# Patient Record
Sex: Male | Born: 1965 | Race: White | Hispanic: Yes | Smoking: Never smoker
Health system: Southern US, Community
[De-identification: ages and names within clinical notes are randomized; demographics above are authoritative.]

## PROBLEM LIST (undated history)

## (undated) DIAGNOSIS — E785 Hyperlipidemia, unspecified: Secondary | ICD-10-CM

## (undated) DIAGNOSIS — I2510 Atherosclerotic heart disease of native coronary artery without angina pectoris: Secondary | ICD-10-CM

## (undated) DIAGNOSIS — J690 Pneumonitis due to inhalation of food and vomit: Secondary | ICD-10-CM

## (undated) DIAGNOSIS — Z86010 Personal history of colonic polyps: Secondary | ICD-10-CM

## (undated) HISTORY — DX: Personal history of colonic polyps: Z86.010

## (undated) HISTORY — DX: Atherosclerotic heart disease of native coronary artery without angina pectoris: I25.10

## (undated) HISTORY — DX: Pneumonitis due to inhalation of food and vomit: J69.0

## (undated) HISTORY — DX: Hyperlipidemia, unspecified: E78.5

## (undated) HISTORY — PX: WISDOM TOOTH EXTRACTION: SHX21

## (undated) HISTORY — PX: COLONOSCOPY W/ POLYPECTOMY: SHX1380

---

## 2014-09-21 ENCOUNTER — Encounter: Payer: Self-pay | Admitting: Family Medicine

## 2014-09-21 ENCOUNTER — Ambulatory Visit (INDEPENDENT_AMBULATORY_CARE_PROVIDER_SITE_OTHER): Payer: PRIVATE HEALTH INSURANCE | Admitting: Family Medicine

## 2014-09-21 VITALS — BP 105/60 | HR 85 | Ht 62.0 in | Wt 113.0 lb

## 2014-09-21 DIAGNOSIS — Z1211 Encounter for screening for malignant neoplasm of colon: Secondary | ICD-10-CM

## 2014-09-21 DIAGNOSIS — Z0000 Encounter for general adult medical examination without abnormal findings: Secondary | ICD-10-CM | POA: Diagnosis not present

## 2014-09-21 NOTE — Progress Notes (Signed)
CC: Jonathan Mclaughlin is a 49 y.o. male is here for Establish Care and Annual Exam   Subjective: HPI:  Colonoscopy: has never had a colonoscopy, orders were placed today for referral. I've encouraged him to follow through with this to help with colon cancer detection Prostate: Discussed screening risks/beneifts with patient during today's visit. He would like to have a PSA done  Influenza Vaccine: up-to-date Pneumovax: no current indication Td/Tdap: up-to-date Zoster: (Start 51 yo)  Very pleasant 49 year old here to establish care requesting complete physical exam.   Review of Systems - General ROS: negative for - chills, fever, night sweats, weight gain or weight loss Ophthalmic ROS: negative for - decreased vision Psychological ROS: negative for - anxiety or depression ENT ROS: negative for - hearing change, nasal congestion, tinnitus or allergies Hematological and Lymphatic ROS: negative for - bleeding problems, bruising or swollen lymph nodes Breast ROS: negative Respiratory ROS: no cough, shortness of breath, or wheezing Cardiovascular ROS: no chest pain or dyspnea on exertion Gastrointestinal ROS: no abdominal pain, change in bowel habits, or black or bloody stools Genito-Urinary ROS: negative for - genital discharge, genital ulcers, incontinence or abnormal bleeding from genitals Musculoskeletal ROS: negative for - joint pain or muscle pain Neurological ROS: negative for - headaches or memory loss Dermatological ROS: negative for lumps, mole changes, rash and skin lesion changes  History reviewed. No pertinent past medical history.  History reviewed. No pertinent past surgical history. History reviewed. No pertinent family history.  History   Social History  . Marital Status: Divorced    Spouse Name: N/A  . Number of Children: N/A  . Years of Education: N/A   Occupational History  . Not on file.   Social History Main Topics  . Smoking status: Never Smoker   .  Smokeless tobacco: Not on file  . Alcohol Use: No  . Drug Use: No  . Sexual Activity:    Partners: Female   Other Topics Concern  . Not on file   Social History Narrative  . No narrative on file     Objective: BP 105/60 mmHg  Pulse 85  Ht 5\' 2"  (1.575 m)  Wt 113 lb (51.256 kg)  BMI 20.66 kg/m2  General: No Acute Distress HEENT: Atraumatic, normocephalic, conjunctivae normal without scleral icterus.  No nasal discharge, hearing grossly intact, TMs with good landmarks bilaterally with no middle ear abnormalities, posterior pharynx clear without oral lesions. Neck: Supple, trachea midline, no cervical nor supraclavicular adenopathy. Pulmonary: Clear to auscultation bilaterally without wheezing, rhonchi, nor rales. Cardiac: Regular rate and rhythm.  No murmurs, rubs, nor gallops. No peripheral edema.  2+ peripheral pulses bilaterally. Abdomen: Bowel sounds normal.  No masses.  Non-tender without rebound.  Negative Murphy's sign. MSK: Grossly intact, no signs of weakness.  Full strength throughout upper and lower extremities.  Full ROM in upper and lower extremities.  No midline spinal tenderness. Neuro: Gait unremarkable, CN II-XII grossly intact.  C5-C6 Reflex 2/4 Bilaterally, L4 Reflex 2/4 Bilaterally.  Cerebellar function intact. Skin: No rashes. Psych: Alert and oriented to person/place/time.  Thought process normal. No anxiety/depression.  Assessment & Plan: Jonathan Mclaughlin was seen today for establish care and annual exam.  Diagnoses and all orders for this visit:  Annual physical exam Orders: -     Lipid panel -     PSA -     CBC -     COMPLETE METABOLIC PANEL WITH GFR  Screening for colon cancer Orders: -     Ambulatory  referral to Gastroenterology   Healthy lifestyle interventions including but not limited to regular exercise, a healthy low fat diet, moderation of salt intake, the dangers of tobacco/alcohol/recreational drug use, nutrition supplementation, and accident  avoidance were discussed with the patient and a handout was provided for future reference.  Return in about 1 year (around 09/21/2015).

## 2014-09-22 ENCOUNTER — Encounter: Payer: Self-pay | Admitting: Gastroenterology

## 2014-09-23 LAB — COMPLETE METABOLIC PANEL WITH GFR
ALBUMIN: 3.9 g/dL (ref 3.5–5.2)
ALT: 13 U/L (ref 0–53)
AST: 18 U/L (ref 0–37)
Alkaline Phosphatase: 66 U/L (ref 39–117)
BUN: 12 mg/dL (ref 6–23)
CALCIUM: 8.8 mg/dL (ref 8.4–10.5)
CHLORIDE: 101 meq/L (ref 96–112)
CO2: 26 mEq/L (ref 19–32)
Creat: 0.76 mg/dL (ref 0.50–1.35)
GFR, Est African American: >89 mL/min
GFR, Est Non African American: >89 mL/min
Glucose, Bld: 76 mg/dL (ref 70–99)
Potassium: 3.8 mEq/L (ref 3.5–5.3)
Sodium: 139 mEq/L (ref 135–145)
Total Bilirubin: 0.7 mg/dL (ref 0.2–1.2)
Total Protein: 6.5 g/dL (ref 6.0–8.3)

## 2014-09-23 LAB — LIPID PANEL
CHOL/HDL RATIO: 3.1 ratio
Cholesterol: 187 mg/dL (ref 0–200)
HDL: 61 mg/dL (ref >=40)
LDL CALC: 116 mg/dL — AB (ref 0–99)
TRIGLYCERIDES: 51 mg/dL (ref <150)
VLDL: 10 mg/dL (ref 0–40)

## 2014-09-23 LAB — CBC
HCT: 43.1 % (ref 39.0–52.0)
HEMOGLOBIN: 14.6 g/dL (ref 13.0–17.0)
MCH: 29.9 pg (ref 26.0–34.0)
MCHC: 33.9 g/dL (ref 30.0–36.0)
MCV: 88.1 fL (ref 78.0–100.0)
MPV: 9.1 fL (ref 8.6–12.4)
Platelets: 255 10*3/uL (ref 150–400)
RBC: 4.89 MIL/uL (ref 4.22–5.81)
RDW: 13.8 % (ref 11.5–15.5)
WBC: 7.7 10*3/uL (ref 4.0–10.5)

## 2014-09-23 LAB — PSA: PSA: 1.04 ng/mL (ref <=4.00)

## 2014-12-01 ENCOUNTER — Ambulatory Visit (AMBULATORY_SURGERY_CENTER): Payer: Self-pay | Admitting: *Deleted

## 2014-12-01 VITALS — Ht 60.5 in | Wt 114.0 lb

## 2014-12-01 DIAGNOSIS — Z1211 Encounter for screening for malignant neoplasm of colon: Secondary | ICD-10-CM

## 2014-12-01 MED ORDER — NA SULFATE-K SULFATE-MG SULF 17.5-3.13-1.6 GM/177ML PO SOLN: 1.0000 | Freq: Once | ORAL | Status: DC

## 2014-12-01 NOTE — Progress Notes (Signed)
No egg or soy allergy. No anesthesia problems. Never been sedated other than for wisdom teeth.  No home O2.  No diet meds.

## 2014-12-09 ENCOUNTER — Ambulatory Visit (AMBULATORY_SURGERY_CENTER): Payer: PRIVATE HEALTH INSURANCE | Admitting: Gastroenterology

## 2014-12-09 ENCOUNTER — Encounter: Payer: Self-pay | Admitting: Gastroenterology

## 2014-12-09 VITALS — BP 93/56 | HR 63 | Temp 97.3°F | Resp 19 | Ht 62.0 in | Wt 113.0 lb

## 2014-12-09 DIAGNOSIS — D120 Benign neoplasm of cecum: Secondary | ICD-10-CM

## 2014-12-09 DIAGNOSIS — Z1211 Encounter for screening for malignant neoplasm of colon: Secondary | ICD-10-CM | POA: Diagnosis present

## 2014-12-09 DIAGNOSIS — K5730 Diverticulosis of large intestine without perforation or abscess without bleeding: Secondary | ICD-10-CM

## 2014-12-09 MED ORDER — SODIUM CHLORIDE 0.9 % IV SOLN: 500.0000 mL | INTRAVENOUS | Status: DC

## 2014-12-09 NOTE — Op Note (Signed)
[facility]  [address]. [place], [id number]   COLONOSCOPY PROCEDURE REPORT  PATIENT: Jonathan Mclaughlin, Jonathan Mclaughlin  MR#: [id number] BIRTHDATE: [birth year] , 48  yrs. old GENDER: male ENDOSCOPIST: Inda Castle, MD REFERRED BY: Marcial Pacas, DO PROCEDURE DATE:  12/09/2014 PROCEDURE:   Colonoscopy, screening and Colonoscopy with cold biopsy polypectomy First Screening Colonoscopy - Avg.  risk and is 50 yrs.  old or older Yes.  Prior Negative Screening - Now for repeat screening. N/A  History of Adenoma - Now for follow-up colonoscopy & has been > or = to 3 yrs.  N/A  Polyps removed today? Yes ASA CLASS:   Class II INDICATIONS:Colorectal Neoplasm Risk Assessment for this procedure is average risk. MEDICATIONS: Monitored anesthesia care and Propofol 150 mg IV  DESCRIPTION OF PROCEDURE:   After the risks benefits and alternatives of the procedure were thoroughly explained, informed consent was obtained.  The digital rectal exam revealed no abnormalities of the rectum.   The LB [id number]  endoscope was introduced through the anus and advanced to the cecum, which was identified by both the appendix and ileocecal valve. No adverse events experienced.   The quality of the prep was (Suprep was used) excellent.  The instrument was then slowly withdrawn as the colon was fully examined. Estimated blood loss is zero unless otherwise noted in this procedure report.      COLON FINDINGS: A sessile polyp measuring 2 mm in size was found at the cecum.  Polypectomies were performed with cold forceps.   There was mild diverticulosis noted in the sigmoid colon.   The examination was otherwise normal.  Retroflexed views revealed no abnormalities. The time to cecum = 2.7 Withdrawal time = 6.5   The scope was withdrawn and the procedure completed. COMPLICATIONS: There were no immediate complications.  ENDOSCOPIC IMPRESSION: 1.   Sessile polyp was found at the cecum; polypectomies  were performed with cold forceps 2.   Mild diverticulosis was noted in the sigmoid colon 3.   The examination was otherwise normal  RECOMMENDATIONS: If the polyp(s) removed today are proven to be adenomatous (pre-cancerous) polyps, you will need a repeat colonoscopy in 5 years.  Otherwise you should continue to follow colorectal cancer screening guidelines for "routine risk" patients with colonoscopy in 10 years.  You will receive a letter within 1-2 weeks with the results of your biopsy as well as final recommendations.  Please call my office if you have not received a letter after 3 weeks.  eSigned:  Inda Castle, MD 12/09/2014 10:02 AM   cc:   PATIENT NAME:  Jonathan Mclaughlin, Jonathan Mclaughlin MR#: [id number]

## 2014-12-09 NOTE — Progress Notes (Signed)
Called to room to assist during endoscopic procedure.  Patient ID and intended procedure confirmed with present staff. Received instructions for my participation in the procedure from the performing physician.  

## 2014-12-09 NOTE — Progress Notes (Signed)
To recovery, Report to Conchita Paris, RN, VSS

## 2014-12-09 NOTE — Patient Instructions (Signed)
YOU HAD AN ENDOSCOPIC PROCEDURE TODAY AT THE [facility]:   Refer to the procedure report that was given to you for any specific questions about what was found during the examination.  If the procedure report does not answer your questions, please call your gastroenterologist to clarify.  If you requested that your care partner not be given the details of your procedure findings, then the procedure report has been included in a sealed envelope for you to review at your convenience later.  YOU SHOULD EXPECT: Some feelings of bloating in the abdomen. Passage of more gas than usual.  Walking can help get rid of the air that was put into your GI tract during the procedure and reduce the bloating. If you had a lower endoscopy (such as a colonoscopy or flexible sigmoidoscopy) you may notice spotting of blood in your stool or on the toilet paper. If you underwent a bowel prep for your procedure, you may not have a normal bowel movement for a few days.  Please Note:  You might notice some irritation and congestion in your nose or some drainage.  This is from the oxygen used during your procedure.  There is no need for concern and it should clear up in a day or so.  SYMPTOMS TO REPORT IMMEDIATELY:   Following lower endoscopy (colonoscopy or flexible sigmoidoscopy):  Excessive amounts of blood in the stool  Significant tenderness or worsening of abdominal pains  Swelling of the abdomen that is new, acute  Fever of 100F or higher   For urgent or emergent issues, a gastroenterologist can be reached at any hour by calling [phone].   DIET: Your first meal following the procedure should be a small meal and then it is ok to progress to your normal diet. Heavy or fried foods are harder to digest and may make you feel nauseous or bloated.  Likewise, meals heavy in dairy and vegetables can increase bloating.  Drink plenty of fluids but you should avoid alcoholic beverages for 24 hours. Try to  increase the fiber in your diet.  ACTIVITY:  You should plan to take it easy for the rest of today and you should NOT DRIVE or use heavy machinery until tomorrow (because of the sedation medicines used during the test).    FOLLOW UP: Our staff will call the number listed on your records the next business day following your procedure to check on you and address any questions or concerns that you may have regarding the information given to you following your procedure. If we do not reach you, we will leave a message.  However, if you are feeling well and you are not experiencing any problems, there is no need to return our call.  We will assume that you have returned to your regular daily activities without incident.  If any biopsies were taken you will be contacted by phone or by letter within the next 1-3 weeks.  Please call us at [phone] if you have not heard about the biopsies in 3 weeks.    SIGNATURES/CONFIDENTIALITY: You and/or your care partner have signed paperwork which will be entered into your electronic medical record.  These signatures attest to the fact that that the information above on your After Visit Summary has been reviewed and is understood.  Full responsibility of the confidentiality of this discharge information lies with you and/or your care-partner.  Read all of the handouts given to you by your recovery room nurse. 

## 2014-12-12 ENCOUNTER — Telehealth: Payer: Self-pay | Admitting: *Deleted

## 2014-12-12 NOTE — Telephone Encounter (Signed)
Number identifier, left message, follow-up  

## 2014-12-13 ENCOUNTER — Encounter: Payer: Self-pay | Admitting: Gastroenterology

## 2015-01-12 ENCOUNTER — Encounter: Payer: Self-pay | Admitting: Family Medicine

## 2015-01-12 DIAGNOSIS — Z860100 Personal history of colon polyps, unspecified: Secondary | ICD-10-CM

## 2015-01-12 HISTORY — DX: Personal history of colonic polyps: Z86.010

## 2015-01-12 HISTORY — DX: Personal history of colon polyps, unspecified: Z86.0100

## 2015-05-12 ENCOUNTER — Emergency Department (INDEPENDENT_AMBULATORY_CARE_PROVIDER_SITE_OTHER): Payer: PRIVATE HEALTH INSURANCE

## 2015-05-12 ENCOUNTER — Emergency Department
Admission: EM | Admit: 2015-05-12 | Discharge: 2015-05-12 | Disposition: A | Discharge status: Home / Self Care | Payer: PRIVATE HEALTH INSURANCE | Source: Home / Self Care | Attending: Family Medicine | Admitting: Family Medicine

## 2015-05-12 ENCOUNTER — Encounter: Payer: Self-pay | Admitting: *Deleted

## 2015-05-12 DIAGNOSIS — M5412 Radiculopathy, cervical region: Secondary | ICD-10-CM

## 2015-05-12 DIAGNOSIS — S134XXA Sprain of ligaments of cervical spine, initial encounter: Secondary | ICD-10-CM

## 2015-05-12 DIAGNOSIS — S139XXA Sprain of joints and ligaments of unspecified parts of neck, initial encounter: Secondary | ICD-10-CM

## 2015-05-12 DIAGNOSIS — M542 Cervicalgia: Secondary | ICD-10-CM

## 2015-05-12 MED ORDER — PREDNISONE 20 MG PO TABS: 20.0000 mg | ORAL_TABLET | Freq: Two times a day (BID) | ORAL | Status: DC

## 2015-05-12 MED ORDER — CYCLOBENZAPRINE HCL 10 MG PO TABS: ORAL_TABLET | ORAL | Status: DC
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
## 2015-05-12 NOTE — Discharge Instructions (Signed)
Apply ice pack for 20 to 30 minutes, 3 to 4 times daily  Continue until pain decreases.    Cervical Radiculopathy Cervical radiculopathy happens when a nerve in the neck (cervical nerve) is pinched or bruised. This condition can develop because of an injury or as part of the normal aging process. Pressure on the cervical nerves can cause pain or numbness that runs from the neck all the way down into the arm and fingers. Usually, this condition gets better with rest. Treatment may be needed if the condition does not improve.  CAUSES This condition may be caused by:  Injury.  Slipped (herniated) disk.  Muscle tightness in the neck because of overuse.  Arthritis.  Breakdown or degeneration in the bones and joints of the spine (spondylosis) due to aging.  Bone spurs that may develop near the cervical nerves. SYMPTOMS Symptoms of this condition include:  Pain that runs from the neck to the arm and hand. The pain can be severe or irritating. It may be worse when the neck is moved.  Numbness or weakness in the affected arm and hand. DIAGNOSIS This condition may be diagnosed based on symptoms, medical history, and a physical exam. You may also have tests, including:  X-rays.  CT scan.  MRI.  Electromyogram (EMG).  Nerve conduction tests. TREATMENT In many cases, treatment is not needed for this condition. With rest, the condition usually gets better over time. If treatment is needed, options may include:  Wearing a soft neck collar for short periods of time.  Physical therapy to strengthen your neck muscles.  Medicines, such as NSAIDs, oral corticosteroids, or spinal injections.  Surgery. This may be needed if other treatments do not help. Various types of surgery may be done depending on the cause of your problems. HOME CARE INSTRUCTIONS Managing Pain  Take over-the-counter and prescription medicines only as told by your health care provider.  If directed, apply ice to  the affected area.  Put ice in a plastic bag.  Place a towel between your skin and the bag.  Leave the ice on for 20 minutes, 2-3 times per day.  If ice does not help, you can try using heat. Take a warm shower or warm bath, or use a heat pack as told by your health care provider.  Try a gentle neck and shoulder massage to help relieve symptoms. Activity  Rest as needed. Follow instructions from your health care provider about any restrictions on activities.  Do stretching and strengthening exercises as told by your health care provider or physical therapist. General Instructions  If you were given a soft collar, wear it as told by your health care provider.  Use a flat pillow when you sleep.  Keep all follow-up visits as told by your health care provider. This is important. SEEK MEDICAL CARE IF:  Your condition does not improve with treatment. SEEK IMMEDIATE MEDICAL CARE IF:  Your pain gets much worse and cannot be controlled with medicines.  You have weakness or numbness in your hand, arm, face, or leg.  You have a high fever.  You have a stiff, rigid neck.  You lose control of your bowels or your bladder (have incontinence).  You have trouble with walking, balance, or speaking.   This information is not intended to replace advice given to you by your health care provider. Make sure you discuss any questions you have with your health care provider.   Document Released: 12/04/2000 Document Revised: 11/30/2014 Document Reviewed: 05/05/2014  Elsevier Interactive Patient Education ©2016 [employer] ° °

## 2015-09-20 ENCOUNTER — Ambulatory Visit (INDEPENDENT_AMBULATORY_CARE_PROVIDER_SITE_OTHER): Payer: PRIVATE HEALTH INSURANCE | Admitting: Family Medicine

## 2015-09-20 ENCOUNTER — Encounter: Payer: Self-pay | Admitting: Family Medicine

## 2015-09-20 VITALS — BP 101/69 | HR 77 | Wt 121.0 lb

## 2015-09-20 DIAGNOSIS — Z0000 Encounter for general adult medical examination without abnormal findings: Secondary | ICD-10-CM

## 2015-09-20 DIAGNOSIS — Z125 Encounter for screening for malignant neoplasm of prostate: Secondary | ICD-10-CM

## 2015-09-20 LAB — CBC
HEMATOCRIT: 44.9 % (ref 38.5–50.0)
Hemoglobin: 15.5 g/dL (ref 13.2–17.1)
MCH: 30.5 pg (ref 27.0–33.0)
MCHC: 34.5 g/dL (ref 32.0–36.0)
MCV: 88.2 fL (ref 80.0–100.0)
MPV: 9.3 fL (ref 7.5–12.5)
PLATELETS: 216 10*3/uL (ref 140–400)
RBC: 5.09 MIL/uL (ref 4.20–5.80)
RDW: 13.3 % (ref 11.0–15.0)
WBC: 9.1 10*3/uL (ref 3.8–10.8)

## 2015-09-20 NOTE — Progress Notes (Signed)
CC: Jonathan Mclaughlin is a 50 y.o. male is here for Annual Exam   Subjective: HPI:  Colonoscopy: Due 2021 Prostate: Discussed screening risks/beneifts with patient today, screening with PSA   Influenza Vaccine: no indication Pneumovax: no indication Td/Tdap: UTD Zoster: (Start 52 yo)  Requesting complete physical exam With no complaints today  Review of Systems - General ROS: negative for - chills, fever, night sweats, weight gain or weight loss Ophthalmic ROS: negative for - decreased vision Psychological ROS: negative for - anxiety or depression ENT ROS: negative for - hearing change, nasal congestion, tinnitus or allergies Hematological and Lymphatic ROS: negative for - bleeding problems, bruising or swollen lymph nodes Breast ROS: negative Respiratory ROS: no cough, shortness of breath, or wheezing Cardiovascular ROS: no chest pain or dyspnea on exertion Gastrointestinal ROS: no abdominal pain, change in bowel habits, or black or bloody stools Genito-Urinary ROS: negative for - genital discharge, genital ulcers, incontinence or abnormal bleeding from genitals Musculoskeletal ROS: negative for - joint pain or muscle pain Neurological ROS: negative for - headaches or memory loss Dermatological ROS: negative for lumps, mole changes, rash and skin lesion changes   History reviewed. No pertinent past medical history.  Past Surgical History  Procedure Laterality Date  . Wisdom tooth extraction     Family History  Problem Relation Age of Onset  . Colon cancer Neg Hx     Social History   Social History  . Marital Status: Divorced    Spouse Name: N/A  . Number of Children: N/A  . Years of Education: N/A   Occupational History  . Not on file.   Social History Main Topics  . Smoking status: Never Smoker   . Smokeless tobacco: Never Used  . Alcohol Use: No  . Drug Use: No  . Sexual Activity:    Partners: Female   Other Topics Concern  . Not on file   Social History  Narrative     Objective: BP 101/69 mmHg  Pulse 77  Wt 121 lb (54.885 kg)  General: No Acute Distress HEENT: Atraumatic, normocephalic, conjunctivae normal without scleral icterus.  No nasal discharge, hearing grossly intact, TMs with good landmarks bilaterally with no middle ear abnormalities, posterior pharynx clear without oral lesions. Neck: Supple, trachea midline, no cervical nor supraclavicular adenopathy. Pulmonary: Clear to auscultation bilaterally without wheezing, rhonchi, nor rales. Cardiac: Regular rate and rhythm.  No murmurs, rubs, nor gallops. No peripheral edema.  2+ peripheral pulses bilaterally. Abdomen: Bowel sounds normal.  No masses.  Non-tender without rebound.  Negative Murphy's sign. MSK: Grossly intact, no signs of weakness.  Full strength throughout upper and lower extremities.  Full ROM in upper and lower extremities.  No midline spinal tenderness. Neuro: Gait unremarkable, CN II-XII grossly intact.  C5-C6 Reflex 2/4 Bilaterally, L4 Reflex 2/4 Bilaterally.  Cerebellar function intact. Skin: No rashes. Psych: Alert and oriented to person/place/time.  Thought process normal. No anxiety/depression.  Assessment & Plan: Simmon was seen today for annual exam.  Diagnoses and all orders for this visit:  Annual physical exam -     Lipid panel -     COMPLETE METABOLIC PANEL WITH GFR -     CBC -     PSA  Screening for prostate cancer -     PSA  Healthy lifestyle interventions including but not limited to regular exercise, a healthy low fat diet, moderation of salt intake, the dangers of tobacco/alcohol/recreational drug use, nutrition supplementation, and accident avoidance were discussed with the patient  and a handout was provided for future reference.  Return in about 1 year (around 09/19/2016) for Annual physical.

## 2015-09-20 NOTE — Patient Instructions (Signed)
Dr. Chailyn Racette's General Advice Following Your Complete Physical Exam  The Benefits of Regular Exercise: Unless you suffer from an uncontrolled cardiovascular condition, studies strongly suggest that regular exercise and physical activity will add to both the quality and length of your life.  The World Health Organization recommends 150 minutes of moderate intensity aerobic activity every week.  This is best split over 3-4 days a week, and can be as simple as a brisk walk for just over 35 minutes "most days of the week".  This type of exercise has been shown to lower LDL-Cholesterol, lower average blood sugars, lower blood pressure, lower cardiovascular disease risk, improve memory, and increase one's overall sense of wellbeing.  The addition of anaerobic (or "strength training") exercises offers additional benefits including but not limited to increased metabolism, prevention of osteoporosis, and improved overall cholesterol levels.  How Can I Strive For A Low-Fat Diet?: Current guidelines recommend that 25-35 percent of your daily energy (food) intake should come from fats.  One might ask how can this be achieved without having to dissect each meal on a daily basis?  Switch to skim or 1% milk instead of whole milk.  Focus on lean meats such as ground turkey, fresh fish, baked chicken, and lean cuts of beef as your source of dietary protein.  Limit saturated fat consumption to less than 10% of your daily caloric intake.  Limit trans fatty acid consumption primarily by limiting synthetic trans fats such as partially hydrogenated oils (Ex: fried fast foods).  Substitute olive or vegetable oil for solid fats where possible.  Moderation of Salt Intake: Provided you don't carry a diagnosis of congestive heart failure nor renal failure, I recommend a daily allowance of no more than 2300 mg of salt (sodium).  Keeping under this daily goal is associated with a decreased risk of cardiovascular events, creeping  above it can lead to elevated blood pressures and increases your risk of cardiovascular events.  Milligrams (mg) of salt is listed on all nutrition labels, and your daily intake can add up faster than you think.  Most canned and frozen dinners can pack in over half your daily salt allowance in one meal.    Lifestyle Health Risks: Certain lifestyle choices carry specific health risks.  As you may already know, tobacco use has been associated with increasing one's risk of cardiovascular disease, pulmonary disease, numerous cancers, among many other issues.  What you may not know is that there are medications and nicotine replacement strategies that can more than double your chances of successfully quitting.  I would be thrilled to help manage your quitting strategy if you currently use tobacco products.  When it comes to alcohol use, I've yet to find an "ideal" daily allowance.  Provided an individual does not have a medical condition that is exacerbated by alcohol consumption, general guidelines determine "safe drinking" as no more than two standard drinks for a man or no more than one standard drink for a male per day.  However, much debate still exists on whether any amount of alcohol consumption is technically "safe".  My general advice, keep alcohol consumption to a minimum for general health promotion.  If you or others believe that alcohol, tobacco, or recreational drug use is interfering with your life, I would be happy to provide confidential counseling regarding treatment options.  General "Over The Counter" Nutrition Advice: Postmenopausal women should aim for a daily calcium intake of 1200 mg, however a significant portion of this might already be   provided by diets including milk, yogurt, cheese, and other dairy products.  Vitamin D has been shown to help preserve bone density, prevent fatigue, and has even been shown to help reduce falls in the elderly.  Ensuring a daily intake of 800 Units of  Vitamin D is a good place to start to enjoy the above benefits, we can easily check your Vitamin D level to see if you'd potentially benefit from supplementation beyond 800 Units a day.  Folic Acid intake should be of particular concern to women of childbearing age.  Daily consumption of 400-800 mcg of Folic Acid is recommended to minimize the chance of spinal cord defects in a fetus should pregnancy occur.    For many adults, accidents still remain one of the most common culprits when it comes to cause of death.  Some of the simplest but most effective preventitive habits you can adopt include regular seatbelt use, proper helmet use, securing firearms, and regularly testing your smoke and carbon monoxide detectors.  Jonathan Klingbeil B. Markevion Lattin DO [facility] [address], [address] [place], [place] [address] Phone: [phone]  

## 2015-09-21 ENCOUNTER — Encounter: Payer: PRIVATE HEALTH INSURANCE | Admitting: Family Medicine

## 2015-09-21 ENCOUNTER — Encounter: Payer: Self-pay | Admitting: Family Medicine

## 2015-09-21 LAB — COMPLETE METABOLIC PANEL WITH GFR
ALT: 19 U/L (ref 9–46)
AST: 20 U/L (ref 10–35)
Albumin: 4.2 g/dL (ref 3.6–5.1)
Alkaline Phosphatase: 62 U/L (ref 40–115)
BILIRUBIN TOTAL: 0.6 mg/dL (ref 0.2–1.2)
BUN: 19 mg/dL (ref 7–25)
CO2: 23 mmol/L (ref 20–31)
CREATININE: 0.73 mg/dL (ref 0.70–1.33)
Calcium: 8.8 mg/dL (ref 8.6–10.3)
Chloride: 103 mmol/L (ref 98–110)
GFR, Est African American: >89 mL/min (ref >=60)
GFR, Est Non African American: >89 mL/min (ref >=60)
GLUCOSE: 93 mg/dL (ref 65–99)
Potassium: 3.8 mmol/L (ref 3.5–5.3)
SODIUM: 138 mmol/L (ref 135–146)
TOTAL PROTEIN: 6.9 g/dL (ref 6.1–8.1)

## 2015-09-21 LAB — LIPID PANEL
Cholesterol: 220 mg/dL — ABNORMAL HIGH (ref 125–200)
HDL: 69 mg/dL (ref >=40)
LDL CALC: 140 mg/dL — AB (ref <130)
TRIGLYCERIDES: 54 mg/dL (ref <150)
Total CHOL/HDL Ratio: 3.2 Ratio (ref <=5.0)
VLDL: 11 mg/dL (ref <30)

## 2015-09-21 LAB — PSA: PSA: 1.08 ng/mL (ref <=4.00)

## 2016-09-16 ENCOUNTER — Encounter: Payer: PRIVATE HEALTH INSURANCE | Admitting: Physician Assistant

## 2016-09-16 DIAGNOSIS — Z0189 Encounter for other specified special examinations: Secondary | ICD-10-CM

## 2016-09-19 ENCOUNTER — Ambulatory Visit (INDEPENDENT_AMBULATORY_CARE_PROVIDER_SITE_OTHER): Payer: PRIVATE HEALTH INSURANCE

## 2016-09-19 ENCOUNTER — Ambulatory Visit (INDEPENDENT_AMBULATORY_CARE_PROVIDER_SITE_OTHER): Payer: PRIVATE HEALTH INSURANCE | Admitting: Physician Assistant

## 2016-09-19 ENCOUNTER — Encounter: Payer: Self-pay | Admitting: Physician Assistant

## 2016-09-19 VITALS — BP 94/57 | HR 82 | Resp 16 | Wt 124.3 lb

## 2016-09-19 DIAGNOSIS — R0789 Other chest pain: Secondary | ICD-10-CM | POA: Diagnosis not present

## 2016-09-19 DIAGNOSIS — Z0000 Encounter for general adult medical examination without abnormal findings: Secondary | ICD-10-CM

## 2016-09-19 DIAGNOSIS — R0602 Shortness of breath: Secondary | ICD-10-CM | POA: Insufficient documentation

## 2016-09-19 DIAGNOSIS — R9389 Abnormal findings on diagnostic imaging of other specified body structures: Secondary | ICD-10-CM

## 2016-09-19 DIAGNOSIS — J9859 Other diseases of mediastinum, not elsewhere classified: Secondary | ICD-10-CM

## 2016-09-19 DIAGNOSIS — R938 Abnormal findings on diagnostic imaging of other specified body structures: Secondary | ICD-10-CM

## 2016-09-19 DIAGNOSIS — Z1159 Encounter for screening for other viral diseases: Secondary | ICD-10-CM

## 2016-09-19 DIAGNOSIS — Z125 Encounter for screening for malignant neoplasm of prostate: Secondary | ICD-10-CM | POA: Diagnosis not present

## 2016-09-19 DIAGNOSIS — E782 Mixed hyperlipidemia: Secondary | ICD-10-CM | POA: Diagnosis not present

## 2016-09-19 LAB — CBC
HCT: 43.6 % (ref 38.5–50.0)
Hemoglobin: 14.4 g/dL (ref 13.2–17.1)
MCH: 29.2 pg (ref 27.0–33.0)
MCHC: 33 g/dL (ref 32.0–36.0)
MCV: 88.4 fL (ref 80.0–100.0)
MPV: 9 fL (ref 7.5–12.5)
PLATELETS: 253 10*3/uL (ref 140–400)
RBC: 4.93 MIL/uL (ref 4.20–5.80)
RDW: 13.8 % (ref 11.0–15.0)
WBC: 7.3 10*3/uL (ref 3.8–10.8)

## 2016-09-19 NOTE — Patient Instructions (Addendum)
Physical Activity Recommendations for modifying lipids and lowering blood pressure Engage in aerobic physical activity to reduce LDL-cholesterol, non-HDL-cholesterol, and blood pressure  Frequency: 3-4 sessions per week  Intensity: moderate to vigorous  Duration: 40 minutes on average  Physical Activity Recommendations for secondary prevention 1. Aerobic exercise  Frequency: 3-5 sessions per week  Intensity: 50-80% capacity  Duration: 20 - 60 minutes  Examples: walking, treadmill, cycling, rowing, stair climbing, and arm/leg ergometry  2. Resistance exercise  Frequency: 2-3 sessions per week  Intensity: 10-15 repetitions/set to moderate fatigue  Duration: 1-3 sets of 8-10 upper and lower body exercises  Examples: calisthenics, elastic bands, cuff/hand weights, dumbbels, free weights, wall pulleys, and weight machines  Heart-Healthy Lifestyle  Eating a diet rich in vegetables, fruits and whole grains: also includes low-fat dairy products, poultry, fish, legumes, and nuts; limit intake of sweets, sugar-sweetened beverages and red meats  Getting regular exercise  Maintaining a healthy weight  Not smoking or getting help quitting  Staying on top of your health; for some people, lifestyle changes alone may not be enough to prevent a heart attack or stroke. In these cases, taking a statin at the right dose will most likely be necessary  
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
## 2016-09-20 LAB — COMPREHENSIVE METABOLIC PANEL
ALT: 16 U/L (ref 9–46)
AST: 18 U/L (ref 10–35)
Albumin: 4 g/dL (ref 3.6–5.1)
Alkaline Phosphatase: 82 U/L (ref 40–115)
BUN: 13 mg/dL (ref 7–25)
CHLORIDE: 102 mmol/L (ref 98–110)
CO2: 26 mmol/L (ref 20–31)
CREATININE: 0.7 mg/dL (ref 0.70–1.33)
Calcium: 9.2 mg/dL (ref 8.6–10.3)
Glucose, Bld: 97 mg/dL (ref 65–99)
Potassium: 4.3 mmol/L (ref 3.5–5.3)
SODIUM: 137 mmol/L (ref 135–146)
TOTAL PROTEIN: 7 g/dL (ref 6.1–8.1)
Total Bilirubin: 0.5 mg/dL (ref 0.2–1.2)

## 2016-09-20 LAB — LIPID PANEL W/REFLEX DIRECT LDL
CHOL/HDL RATIO: 3.8 ratio (ref <5.0)
Cholesterol: 200 mg/dL — ABNORMAL HIGH (ref <200)
HDL: 52 mg/dL (ref >40)
LDL-Cholesterol: 129 mg/dL — ABNORMAL HIGH
NON-HDL CHOLESTEROL (CALC): 148 mg/dL — AB (ref <130)
Triglycerides: 90 mg/dL (ref <150)

## 2016-09-20 LAB — PSA: PSA: 1.3 ng/mL (ref <=4.0)

## 2016-09-20 LAB — HEPATITIS C ANTIBODY: HCV Ab: NEGATIVE

## 2016-09-22 DIAGNOSIS — R911 Solitary pulmonary nodule: Secondary | ICD-10-CM | POA: Insufficient documentation

## 2016-09-22 DIAGNOSIS — R9389 Abnormal findings on diagnostic imaging of other specified body structures: Secondary | ICD-10-CM | POA: Insufficient documentation

## 2016-09-22 DIAGNOSIS — IMO0001 Reserved for inherently not codable concepts without codable children: Secondary | ICD-10-CM | POA: Insufficient documentation

## 2016-09-23 NOTE — Progress Notes (Signed)
Your labs look good - PSA is normal - normal kidney function - cholesterol in a healthy range - normal blood counts, no evidence of anemia - no evidence of diabetes

## 2016-09-23 NOTE — Progress Notes (Signed)
Chest x-ray showed some abnormalities. The heart and aorta were mildly enlarged. I have ordered a CT scan on the chest, which I would like him to have done this week. The lungs were clear and there was no sign of infection

## 2016-09-26 ENCOUNTER — Ambulatory Visit (INDEPENDENT_AMBULATORY_CARE_PROVIDER_SITE_OTHER): Payer: PRIVATE HEALTH INSURANCE

## 2016-09-26 DIAGNOSIS — J690 Pneumonitis due to inhalation of food and vomit: Secondary | ICD-10-CM

## 2016-09-26 DIAGNOSIS — R911 Solitary pulmonary nodule: Secondary | ICD-10-CM | POA: Diagnosis not present

## 2016-09-26 DIAGNOSIS — J9859 Other diseases of mediastinum, not elsewhere classified: Secondary | ICD-10-CM

## 2016-09-26 DIAGNOSIS — R9389 Abnormal findings on diagnostic imaging of other specified body structures: Secondary | ICD-10-CM

## 2016-09-26 DIAGNOSIS — R918 Other nonspecific abnormal finding of lung field: Secondary | ICD-10-CM

## 2016-09-26 MED ORDER — IOPAMIDOL (ISOVUE-300) INJECTION 61%
100.0000 mL | Freq: Once | INTRAVENOUS | Status: AC | PRN
Start: 2016-09-26 — End: 2016-09-26
  Administered 2016-09-26: 80 mL via INTRAVENOUS

## 2016-09-27 ENCOUNTER — Encounter: Payer: Self-pay | Admitting: Physician Assistant

## 2016-09-27 DIAGNOSIS — J690 Pneumonitis due to inhalation of food and vomit: Secondary | ICD-10-CM | POA: Insufficient documentation

## 2016-09-27 HISTORY — DX: Pneumonitis due to inhalation of food and vomit: J69.0

## 2016-09-27 MED ORDER — METRONIDAZOLE 500 MG PO TABS: 500.0000 mg | ORAL_TABLET | Freq: Two times a day (BID) | ORAL | 0 refills | Status: AC

## 2016-09-27 MED ORDER — AZITHROMYCIN 250 MG PO TABS: ORAL_TABLET | ORAL | 0 refills | Status: DC

## 2016-09-27 NOTE — Progress Notes (Unsigned)
Ground glass opacity in the RML. Will treat empirically as aspiration pneumonia   4.90mm nodule in the lingula. Surveillance with repeat CT chest w/contrast in 1 year

## 2016-10-08 ENCOUNTER — Encounter: Payer: Self-pay | Admitting: Physician Assistant

## 2016-10-08 ENCOUNTER — Ambulatory Visit (INDEPENDENT_AMBULATORY_CARE_PROVIDER_SITE_OTHER): Payer: PRIVATE HEALTH INSURANCE | Admitting: Physician Assistant

## 2016-10-08 VITALS — BP 95/59 | HR 71 | Temp 98.3°F | Wt 124.0 lb

## 2016-10-08 DIAGNOSIS — R911 Solitary pulmonary nodule: Secondary | ICD-10-CM | POA: Diagnosis not present

## 2016-10-08 DIAGNOSIS — I950 Idiopathic hypotension: Secondary | ICD-10-CM | POA: Diagnosis not present

## 2016-10-08 DIAGNOSIS — IMO0001 Reserved for inherently not codable concepts without codable children: Secondary | ICD-10-CM

## 2016-10-08 DIAGNOSIS — Z09 Encounter for follow-up examination after completed treatment for conditions other than malignant neoplasm: Secondary | ICD-10-CM

## 2016-10-08 DIAGNOSIS — I959 Hypotension, unspecified: Secondary | ICD-10-CM | POA: Insufficient documentation

## 2016-10-08 NOTE — Progress Notes (Signed)
HPI:                                                                Jonathan Mclaughlin is a 51 y.o. male who presents to [facility] [place] today for pneumonia follow-up  Patient found to have a ground glass opacity in the RML on chest CT. He was treated for suspected aspiration pnuemonia with Flagyl and Azithromycin. He completed antibiotic therapy and reports he is feeling well. Denies fever, chills, cough, shortness of breath, or chest discomfort.   Past Medical History:  Diagnosis Date  . Aspiration pneumonia ([place]) 09/27/2016  . History of colonic polyps 01/12/2015   Repeat 11/2019   . Hyperlipidemia    Past Surgical History:  Procedure Laterality Date  . COLONOSCOPY W/ POLYPECTOMY    . WISDOM TOOTH EXTRACTION     Social History  Substance Use Topics  . Smoking status: Never Smoker  . Smokeless tobacco: Never Used  . Alcohol use No   family history includes Alcohol abuse in his father; Arthritis in his mother.  ROS: negative except as noted in the HPI  Medications: No current outpatient prescriptions on file.   No current facility-administered medications for this visit.    No Known Allergies     Objective:  BP (!) 95/59   Pulse 71   Temp 98.3 F (36.8 C)   Wt 124 lb (56.2 kg)   SpO2 95%   BMI 22.68 kg/m  Gen: well-groomed, cooperative, not ill-appearing, no distress HEENT: normal conjunctiva, trachea midline Pulm: Normal work of breathing, normal phonation, clear to auscultation bilaterally, no wheezes, rales or rhonchi CV: Normal rate, regular rhythm, s1 and s2 distinct, no murmurs, clicks or rubs  Neuro: alert and oriented x 3, EOM's intact, no tremor MSK: moving all extremities, normal gait and station, no peripheral edema Skin: warm, dry, intact; no rashes or lesions on exposed skin, no cyanosis   No results found for this or any previous visit (from the past 72 hour(s)). No results found.    Assessment and  Plan: 51 y.o. male with   1. Follow-up exam after treatment - pneumonia has resolved clinically. Afebrile, SpO2 95% on RA   2. Idiopathic hypotension BP Readings from Last 3 Encounters:  10/08/16 (!) 95/59  09/19/16 (!) 94/57  09/20/15 101/69  - patient asymptomatic. Denies orthostasis, lightheadedness, syncope - encouraged PO hydration and increased salt intake  3. Lung nodule <6cm on CT - follow-up CT chest in 1 year to survey lingula pulmonary nodule  Patient education and anticipatory guidance given Patient agrees with treatment plan Follow-up in 1 year for CT chest as needed if symptoms worsen or fail to improve  Darlyne Russian PA-C

## 2016-10-08 NOTE — Patient Instructions (Signed)
   Hipotensin (Hypotension) Cuando el corazn late, bombea la sangre a travs del cuerpo. La fuerza que origina es la presin arterial. Si sufre hipotensin, la presin arterial es baja. Cuando la presin arterial es demasiado baja, es posible que no llegue sangre suficiente al cerebro. Podr sentir debilidad, Tree surgeon, latidos cardacos acelerados o perder el conocimiento (se desmaya). CUIDADOS EN EL HOGAR  Beba gran cantidad de lquido para mantener el pis (orina) de tono claro o amarillo plido.  Tome los [employer] le indic el mdico.  Levntese lentamente luego de estar sentado o acostado.  Use medias de compresin segn las indicaciones de su mdico.  Siga una dieta saludable e incluya alimentos nutritivos como frutas, vegetales, nueces, granos enteros y carnes [place].  SOLICITE AYUDA SI:  Devuelve (vomita) o tiene deposiciones acuosas (diarrea).  Tiene fiebre por ms de 2 - 3 das.  Tiene ms sed que lo habitual.  Se siente dbil y cansado.  SOLICITE AYUDA DE INMEDIATO SI:  Pierde el conocimiento (se desmaya).  Siente dolor en el pecho o latidos cardacos irregulares.  Pierde la sensibilidad en una parte de su cuerpo.  No puede mover los brazos o las piernas.  Tiene dificultad para hablar.  Se siente transpirado o sufre un desmayo.  ASEGRESE DE QUE:  Comprende estas instrucciones.  Controlar su afeccin.  Recibir ayuda de inmediato si no mejora o si empeora.  Esta informacin no tiene Marine scientist el consejo del mdico. Asegrese de hacerle al mdico cualquier pregunta que tenga. Document Released: 09/10/2011 Document Revised: 11/11/2012 Document Reviewed: 08/31/2015 Elsevier Interactive Patient Education  2017 [employer].

## 2016-12-04 ENCOUNTER — Emergency Department (HOSPITAL_COMMUNITY): Payer: PRIVATE HEALTH INSURANCE

## 2016-12-04 ENCOUNTER — Emergency Department (HOSPITAL_COMMUNITY)
Admission: EM | Admit: 2016-12-04 | Discharge: 2016-12-04 | Disposition: A | Discharge status: Home / Self Care | Payer: PRIVATE HEALTH INSURANCE | Attending: Emergency Medicine | Admitting: Emergency Medicine

## 2016-12-04 ENCOUNTER — Encounter (HOSPITAL_COMMUNITY): Payer: Self-pay | Admitting: *Deleted

## 2016-12-04 DIAGNOSIS — R0789 Other chest pain: Secondary | ICD-10-CM | POA: Insufficient documentation

## 2016-12-04 LAB — CBC
HCT: 44.5 % (ref 39.0–52.0)
HEMOGLOBIN: 15.3 g/dL (ref 13.0–17.0)
MCH: 29.7 pg (ref 26.0–34.0)
MCHC: 34.4 g/dL (ref 30.0–36.0)
MCV: 86.4 fL (ref 78.0–100.0)
Platelets: 217 10*3/uL (ref 150–400)
RBC: 5.15 MIL/uL (ref 4.22–5.81)
RDW: 12.4 % (ref 11.5–15.5)
WBC: 7.9 10*3/uL (ref 4.0–10.5)

## 2016-12-04 LAB — BASIC METABOLIC PANEL
ANION GAP: 7 (ref 5–15)
BUN: 12 mg/dL (ref 6–20)
CALCIUM: 9.1 mg/dL (ref 8.9–10.3)
CO2: 28 mmol/L (ref 22–32)
Chloride: 102 mmol/L (ref 101–111)
Creatinine, Ser: 0.73 mg/dL (ref 0.61–1.24)
Glucose, Bld: 103 mg/dL — ABNORMAL HIGH (ref 65–99)
POTASSIUM: 3.8 mmol/L (ref 3.5–5.1)
Sodium: 137 mmol/L (ref 135–145)

## 2016-12-04 LAB — I-STAT TROPONIN, ED: TROPONIN I, POC: 0 ng/mL (ref 0.00–0.08)

## 2016-12-04 NOTE — ED Triage Notes (Signed)
Pt is here with pain in his chest that he woke up with on Sunday, had some Monday and Tuesday.  Today it is much stronger.

## 2016-12-04 NOTE — ED Provider Notes (Signed)
[facility] DEPT Provider Note   CSN: [id number] Arrival date & time: 12/04/16  1635     History   Chief Complaint Chief Complaint  Patient presents with  . Chest Pain    HPI Jonathan Mclaughlin is a 51 y.o. male.  HPI Patient presents with right-sided chest pain. Began 3 days ago. Dull in his right chest. Worse in the mornings. Not worse with exertion. Worse with movements. No shortness of breath. No swelling in his legs. States he thinks he may have slept wrong on it. No trauma. States it feels like a muscle pull. He does not smoke. Does have a history of high cholesterol but no cardiac history. Denies substance abuse. Denies early family history of coronary artery disease.patient has been takingNSAIDs for the pain. States it does help somewhat. Past Medical History:  Diagnosis Date  . Aspiration pneumonia ([place]) 09/27/2016  . History of colonic polyps 01/12/2015   Repeat 11/2019   . Hyperlipidemia     Patient Active Problem List   Diagnosis Date Noted  . Hypotension 10/08/2016  . Abnormal chest x-ray 09/22/2016  . Lung nodule < 6cm on CT 09/22/2016  . Hyperlipidemia 09/21/2015  . History of colonic polyps 01/12/2015    Past Surgical History:  Procedure Laterality Date  . COLONOSCOPY W/ POLYPECTOMY    . WISDOM TOOTH EXTRACTION         Home Medications    Prior to Admission medications   Not on File    Family History Family History  Problem Relation Age of Onset  . Arthritis Mother   . Alcohol abuse Father   . Colon cancer Neg Hx   . Heart attack Neg Hx     Social History Social History  Substance Use Topics  . Smoking status: Never Smoker  . Smokeless tobacco: Never Used  . Alcohol use No     Allergies   Patient has no known allergies.   Review of Systems Review of Systems  Constitutional: Negative for appetite change and fever.  HENT: Negative for congestion.   Respiratory: Negative for cough, shortness of breath and wheezing.     Cardiovascular: Positive for chest pain.  Gastrointestinal: Negative for abdominal pain.  Genitourinary: Negative for flank pain.  Musculoskeletal: Negative for back pain.  Skin: Negative for rash.  Neurological: Negative for speech difficulty and numbness.  Hematological: Negative for adenopathy.  Psychiatric/Behavioral: Negative for confusion.     Physical Exam Updated Vital Signs BP 97/74   Pulse 74   Temp 98.6 F (37 C) (Oral)   Resp (!) 23   SpO2 98%   Physical Exam  Constitutional: He appears well-developed.  HENT:  Head: Normocephalic.  Eyes: No scleral icterus.  Neck: Neck supple.  Cardiovascular: Normal rate.   Pulmonary/Chest: He exhibits tenderness.  Mild tenderness in right anterior lateral chest wall. No rash. No crepitance or deformity.  Abdominal: Soft.  Musculoskeletal: He exhibits no edema.  Neurological: He is alert.  Skin: Skin is warm. Capillary refill takes less than 2 seconds.  Psychiatric: He has a normal mood and affect.     ED Treatments / Results  Labs (all labs ordered are listed, but only abnormal results are displayed) Labs Reviewed  BASIC METABOLIC PANEL - Abnormal; Notable for the following:       Result Value   Glucose, Bld 103 (*)    All other components within normal limits  CBC  I-STAT TROPONIN, ED    EKG  EKG Interpretation  Date/Time:  Wednesday  December 04 2016 16:42:46 EDT Ventricular Rate:  71 PR Interval:  164 QRS Duration: 74 QT Interval:  360 QTC Calculation: 391 R Axis:   87 Text Interpretation:  Normal sinus rhythm Normal ECG Confirmed by Davonna Belling [phone]) on 12/04/2016 9:38:33 PM       Radiology Dg Chest 2 View  Result Date: 12/04/2016 CLINICAL DATA:  Right chest pain starting Sunday, intermittent. EXAM: CHEST  2 VIEW COMPARISON:  Chest CT from 09/26/2016 FINDINGS: The heart size and mediastinal contours are within normal limits. Both lungs are clear. The visualized skeletal structures are  unremarkable. IMPRESSION: No active cardiopulmonary disease. Electronically Signed   By: Van Clines M.D.   On: 12/04/2016 17:23    Procedures Procedures (including critical care time)  Medications Ordered in ED Medications - No data to display   Initial Impression / Assessment and Plan / ED Course  I have reviewed the triage vital signs and the nursing notes.  Pertinent labs & imaging results that were available during my care of the patient were reviewed by me and considered in my medical decision making (see chart for details).     Patient resents with chest pain. Reproducible. Low risk cardiac. Doubt pulmonary embolism. Likely chest wall pain. Discharge home.  Final Clinical Impressions(s) / ED Diagnoses   Final diagnoses:  Chest wall pain    New Prescriptions New Prescriptions   No medications on file     Davonna Belling, MD 12/04/16 2224

## 2017-09-10 ENCOUNTER — Encounter: Payer: Self-pay | Admitting: Physician Assistant

## 2017-09-10 ENCOUNTER — Ambulatory Visit (INDEPENDENT_AMBULATORY_CARE_PROVIDER_SITE_OTHER): Payer: PRIVATE HEALTH INSURANCE | Admitting: Physician Assistant

## 2017-09-10 VITALS — BP 99/66 | HR 76 | Resp 16 | Wt 123.0 lb

## 2017-09-10 DIAGNOSIS — M7711 Lateral epicondylitis, right elbow: Secondary | ICD-10-CM | POA: Diagnosis not present

## 2017-09-10 DIAGNOSIS — E782 Mixed hyperlipidemia: Secondary | ICD-10-CM

## 2017-09-10 DIAGNOSIS — E789 Disorder of lipoprotein metabolism, unspecified: Secondary | ICD-10-CM

## 2017-09-10 DIAGNOSIS — Z125 Encounter for screening for malignant neoplasm of prostate: Secondary | ICD-10-CM

## 2017-09-10 DIAGNOSIS — M25511 Pain in right shoulder: Secondary | ICD-10-CM | POA: Insufficient documentation

## 2017-09-10 DIAGNOSIS — R7301 Impaired fasting glucose: Secondary | ICD-10-CM

## 2017-09-10 DIAGNOSIS — R918 Other nonspecific abnormal finding of lung field: Secondary | ICD-10-CM

## 2017-09-10 DIAGNOSIS — Z130 Encounter for screening for diseases of the blood and blood-forming organs and certain disorders involving the immune mechanism: Secondary | ICD-10-CM | POA: Diagnosis not present

## 2017-09-10 DIAGNOSIS — M79631 Pain in right forearm: Secondary | ICD-10-CM | POA: Insufficient documentation

## 2017-09-10 DIAGNOSIS — Z0000 Encounter for general adult medical examination without abnormal findings: Secondary | ICD-10-CM | POA: Diagnosis not present

## 2017-09-10 NOTE — Patient Instructions (Signed)
Anlisis de antgeno prosttico especfico (Prostate-Specific Antigen Test) POR QU ME DEBO REALIZAR ESTE ANLISIS? El anlisis de antgeno prosttico especfico (PSA) se realiza para Office manager cantidad de PSA que tiene Herbalist. El PSA es un tipo de protena que generalmente est presente en la prstata. Ciertas enfermedades pueden hacer que los niveles sanguneos de PSA aumenten, tales como:  Infeccin de la prstata (prostatitis).  Agrandamiento de la prstata (hipertrofia).  Cncer de prstata. Dado que los niveles de PSA aumentan mucho a causa del cncer de prstata, este anlisis se puede usar para confirmar un diagnstico de Stage manager. Tambin se puede usar para Freight forwarder tratamiento para Science writer de prstata y para ver si el cncer de prstata vuelve a aparecer despus de que el tratamiento ha finalizado. Este [employer] tiene un ndice muy alto de [place] positivos falsos. Por lo tanto, ya no se recomienda la prueba de deteccin de rutina del PSA para todos los hombres. Este tipo de resultado es incorrecto porque indica que una enfermedad o un hallazgo est presente cuando en realidad no lo est. QU TIPO DE MUESTRA SE TOMA? Para esta prueba, se extrae Truddie Coco de [place]. Por lo general, para extraerla, se introduce una aguja en una vena o se pincha un dedo con una aguja pequea. CMO DEBO PREPARARME PARA ESTE ANLISIS? No se requiere preparacin para este anlisis. Sin embargo, Public librarian otros factores que pueden afectar los resultados del [place] de PSA. Para obtener los resultados ms precisos:  Engineer, materials un examen rectal en el perodo de varias horas previas a la extraccin de sangre para este anlisis.  Evite cualquier procedimiento en la prstata en el perodo de las 6 semanas previas a este anlisis.  Evite eyacular dentro de las 24horas previas a este anlisis.  Infrmele al mdico si tuvo una infeccin urinaria reciente.  Infrmele al mdico si est  tomando medicamentos para ayudar a que le crezca el pelo, como finasterida.  Infrmele al mdico si ha estado expuesto a un medicamento llamado dietilestilbestrol. Infrmele al mdico si cualquiera de estos factores corresponde a su caso. Es posible que se le pida que reprograme el [place]. [place]? Los valores de referencia se establecen despus de realizarle el anlisis a un grupo grande de Engineer, manufacturing. Pueden variar [employer], laboratorios y hospitales. Es su responsabilidad retirar el resultado del Why. Consulte en el laboratorio o en el departamento en el que fue realizado el estudio cundo y cmo podr [employer].  Bajo: de 0 a 2,5ng/ml.  Levemente a moderadamente elevado: de 2,6 a 10,0ng/ml.  Moderadamente elevado: de 10,0 a 19,9ng/ml.  Muy elevado: 20ng/ml o ms. [place]? En la [employer] de los hombres con cncer de prstata, los resultados del [place] de PSA son mayores de 4ng/ml. Si el resultado del anlisis est por encima de este nivel, esto puede indicar un aumento del riesgo de cncer de prstata. Un aumento en los niveles de PSA tambin puede indicar otras enfermedades. Hable con el mdico [employer], las opciones de tratamiento y, si es necesario, la necesidad de Optometrist ms [place]. Hable con el mdico si tiene [employer]. Esta informacin no tiene Marine scientist el consejo del mdico. Asegrese de hacerle al mdico cualquier pregunta que tenga. Document Released: 12/30/2012 Document Revised: 04/01/2014 Document Reviewed: 08/04/2013 Elsevier Interactive Patient Education  [address] preventivos en los hombres de 73 a 52 aos de edad Preventive Care 40-64  Years, Male Los cuidados preventivos hacen referencia a las opciones en cuanto al estilo de vida y a las visitas al [employer], las cuales pueden promover la salud y Musician. Qu  incluyen los cuidados preventivos?  Un examen fsico anual. Esto tambin se conoce como control de bienestar anual.  Exmenes dentales [place] al ao.  Exmenes de la vista de rutina. Pregntele al mdico con qu frecuencia debe realizarse un control de la vista.  Opciones personales de estilo de vida, que incluyen lo siguiente: ? [employer] y las encas a diario. ? Realizar actividad fsica con regularidad. ? Tener una dieta saludable. ? Evitar el consumo de tabaco y drogas. ? Limitar el consumo de bebidas alcohlicas. ? Counsellor. ? Tomar una dosis baja de aspirina a diario a partir de los 52 aos de [place]. Qu sucede durante un control de bienestar anual? Los servicios y exmenes de deteccin realizados por su mdico durante el control de bienestar anual dependern de su salud general, factores de riesgo de estilo de vida y los antecedentes familiares de enfermedades. Asesoramiento Su mdico puede preguntarle acerca de:  Consumo de alcohol.  Consumo de tabaco.  Consumo de drogas.  Bienestar emocional.  Bienestar en el hogar y las relaciones personales.  Actividad sexual.  Hbitos de alimentacin.  Trabajo y [place] laboral.  Pruebas de deteccin Pueden hacerle las siguientes pruebas o mediciones:  IT consultant, peso e ndice de masa muscular [facility]).  Presin arterial.  Niveles de lpidos y colesterol. Estos se pueden verificar cada 5 aos o, con ms frecuencia, si usted tiene ms de 56 aos de edad.  Control de la piel.  Pruebas de deteccin de cncer de pulmn. Es posible que se le realice esta prueba de deteccin a partir de los 92 aos de edad, si ha fumado durante 30 aos un paquete diario y sigue fumando o dej el hbito en algn momento en los ltimos 15 aos.  Prueba de Personnel officer en las heces [facility]). Es posible que se le realice esta prueba todos los aos a partir de los 13 aos de [place].  Sigmoidoscopa o colonoscopa  flexible. Es posible que se le realice una sigmoidoscopa cada 5 aos o una colonoscopa cada 10 aos a partir de los 65 aos de [place].  Examen de deteccin del cncer de prstata. Las recomendaciones variarn segn sus antecedentes familiares y Hydrologist.  Anlisis de sangre para la deteccin de la hepatitis C.  Anlisis de sangre para la deteccin de la hepatitis B.  Anlisis de enfermedades de transmisin sexual (ETS).  Pruebas de deteccin de la diabetes. Esto se Set designer un control del azcar en la sangre (glucosa) despus de no haber comido durante un periodo de tiempo (ayuno). Es posible que se le realice esta prueba cada 1 a 3 tres aos.  Hable con su mdico para [employer], las opciones de tratamiento y, si corresponde, la necesidad de Optometrist ms pruebas. Vacunas El mdico puede recomendarle que se aplique algunas vacunas, por ejemplo:  Vacuna contra la gripe. Se recomienda aplicarse esta vacuna todos los aos.  Vacuna contra la difteria, ttanos y tos Dietitian (DTPa, DT). Es posible que tenga que aplicarse un refuerzo contra el ttanos y la difteria (DT) cada 10aos.  Vacuna contra la varicela. Es posible que tenga que aplicrsela si no recibi esta vacuna.  Vacuna contra el herpes zster. Es posible que la necesite despus de los 80 aos de edad.  Edward Jolly  contra el sarampin, rubola y paperas (SRP). Es posible que necesite aplicarse al menos una dosis de la vacuna SRP si naci despus de [phone]. Podra tambin necesitar una segunda dosis.  Vacuna antineumoccica conjugada 13 valente (PCV13). Puede necesitar esta vacuna si tiene determinadas enfermedades y no se vacun anteriormente.  Vacuna antineumoccica de polisacridos (PPSV23). Quizs tenga que aplicarse una o dos dosis si fuma o si sufre determinadas enfermedades.  Vacuna antimeningoccica. Puede necesitar esta vacuna si tiene determinadas afecciones.  Vacuna contra la hepatitis A. Es  posible que necesite esta vacuna si tiene ciertas afecciones o si viaja o trabaja en lugares en los que podra estar expuesto a la hepatitis A.  Vacuna contra la hepatitis B. Es posible que necesite esta vacuna si tiene ciertas afecciones o si viaja o trabaja en lugares en los que podra estar expuesto a la hepatitis B.  Vacuna contra antihaemophilus influenzae tipoB (Hib). Es posible que necesite esta vacuna si tiene determinados factores de [place].  Hable con el mdico sobre qu pruebas de deteccin y qu vacunas necesita, y con qu frecuencia las necesita. Esta informacin no tiene Marine scientist el consejo del mdico. Asegrese de hacerle al mdico cualquier pregunta que tenga. Document Released: 07/23/2016 Document Revised: 07/23/2016 Document Reviewed: 01/10/2015 Elsevier Interactive Patient Education  2018 [employer].

## 2017-09-10 NOTE — Progress Notes (Signed)
HPI:                                                                Jonathan Mclaughlin is a 52 y.o. male who presents to [place] [place] today for annual physical exam  Current concerns:  Intermittent R shoulder pain x 2-3 months. Denies known injury or trauma. 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  Right forearm pain for 2 weeks. Pain is moderate persistent. Worse with gripping/picking up objects. Feels like he may have lifted something incorrectly at work.   Depression screen PHQ 2/9 09/10/2017  Decreased Interest 0  Down, Depressed, Hopeless 0  PHQ - 2 Score 0    No flowsheet data found.    Past Medical History:  Diagnosis Date  . Aspiration pneumonia ([place]) 09/27/2016  . History of colonic polyps 01/12/2015   Repeat 11/2019   . Hyperlipidemia    Past Surgical History:  Procedure Laterality Date  . COLONOSCOPY W/ POLYPECTOMY    . WISDOM TOOTH EXTRACTION     Social History   Tobacco Use  . Smoking status: Never Smoker  . Smokeless tobacco: Never Used  Substance Use Topics  . Alcohol use: No    Alcohol/week: 0.0 oz   family history includes Alcohol abuse in his father; Arthritis in his mother.    ROS: Review of Systems  Eyes: Positive for blurred vision (wears readers).  Musculoskeletal: Positive for joint pain (R shoulder) and myalgias (R forearm).  All other systems reviewed and are negative.    Medications: No current outpatient medications on file.   No current facility-administered medications for this visit.    No Known Allergies     Objective:  BP 99/66   Pulse 76   Resp 16   Wt 123 lb (55.8 kg)   SpO2 98%   BMI 22.50 kg/m  General Appearance:  Alert, cooperative, no distress, appropriate for age                            Head:  Normocephalic, without obvious abnormality                             Eyes:  PERRL, EOM's intact, conjunctiva and cornea clear  Ears:  TM pearly gray color and semitransparent, external ear canals normal, both ears                            Nose:  Nares symmetrical                          Throat:  Lips, tongue, and mucosa are moist, pink, and intact; good dentition                             Neck:  Supple; symmetrical, trachea midline, no adenopathy; thyroid: no enlargement, symmetric, no tenderness/mass/nodules, no carotid bruit  Back:  Symmetrical, no curvature, ROM normal                           Lungs:  Clear to auscultation bilaterally, respirations unlabored                             Heart:  regular rate & normal rhythm, S1 and S2 normal, no murmurs, rubs, or gallops                     Abdomen:  Soft, non-tender, no mass or organomegaly              Genitourinary:  deferred         Musculoskeletal:  Tone and strength strong and symmetrical, all extremities; tenderness over the belly of the brachioradialis, normal gait and station                  Right shoulder: atraumatic, no a/c joint tenderness, ROM and strength intact, negative Neer's, positive Hawkin's                     Lymphatic:  No adenopathy             Skin/Hair/Nails:  Skin warm, dry and intact, no rashes or abnormal dyspigmentation on limited exam                   Neurologic:  Alert and oriented x3, no cranial nerve deficits, DTR's intact, sensation grossly intact, normal gait and station, no tremor Psych: well-groomed, cooperative, good eye contact, euthymic mood, affect mood-congruent, speech is articulate, and thought processes clear and goal-directed    No results found for this or any previous visit (from the past 72 hour(s)). No results found.    Assessment and Plan: 52 y.o. male with   Encounter for annual physical exam - Plan: CBC, Comprehensive metabolic panel, Lipid Panel w/reflex Direct LDL, PSA, Hemoglobin A1c  Abnormal findings on diagnostic imaging of lung - Plan: CT CHEST NODULE FOLLOW UP LOW  DOSE W/O  Screening PSA (prostate specific antigen)  Borderline high cholesterol - Plan: Lipid Panel w/reflex Direct LDL  Fasting hyperglycemia - Plan: Hemoglobin A1c  Screening for blood disease - Plan: CBC, Comprehensive metabolic panel  Right tennis elbow  Nontraumatic pain of right shoulder  - Personally reviewed PMH, PSH, PFH, medications, allergies, HM - Age-appropriate cancer screening: Colonoscopy UTD, due for repeat 11/2019, elects to do annual PSA, no fam hx of prostate cancer, no LUTS - Influenza n/a - Tdap UTD - PHQ2 negative - BP in range - Healthy BMI - Fasting labs pending  History of infiltrate in the RML on CXR and Chest CT 1 year ago. Treated for aspiration pneumonia. Follow-up CXR was normal. Incidental findings of 4.2 mm RML nodule and 4.1 mm lingula nodule. Follow-up CT ordered today for surveillance of these benign appearing nodules.  Pain over the brachioradialis with history or repetitive use suggesting possible tennis elbow Right shoulder pain c/f biceps tendonitis or rotator cuff pathology. Follow-up with Sports Medicine this week  Patient education and anticipatory guidance given Patient agrees with treatment plan Follow-up in 1 year for CPE w/fasting labs or sooner as needed if symptoms worsen or fail to improve  Darlyne Russian PA-C

## 2017-09-11 DIAGNOSIS — E782 Mixed hyperlipidemia: Secondary | ICD-10-CM | POA: Insufficient documentation

## 2017-09-11 MED ORDER — ATORVASTATIN CALCIUM 20 MG PO TABS: 20.0000 mg | ORAL_TABLET | Freq: Every day | ORAL | 3 refills | Status: DC

## 2017-09-11 NOTE — Progress Notes (Signed)
Cholesterol is very high compared to 1 year ago.  Cholesterol-lowering medication is recommended in addition to [place] and regular aerobic exericse Other labs look good

## 2017-09-11 NOTE — Addendum Note (Signed)
Addended by: Nelson Chimes E on: 09/11/2017 11:44 AM   Modules accepted: Orders

## 2017-09-15 ENCOUNTER — Encounter: Payer: Self-pay | Admitting: Family Medicine

## 2017-09-15 ENCOUNTER — Ambulatory Visit: Payer: PRIVATE HEALTH INSURANCE | Admitting: Family Medicine

## 2017-09-15 VITALS — BP 99/62 | HR 76 | Ht 62.01 in | Wt 123.0 lb

## 2017-09-15 DIAGNOSIS — M79631 Pain in right forearm: Secondary | ICD-10-CM

## 2017-09-15 DIAGNOSIS — M25511 Pain in right shoulder: Secondary | ICD-10-CM

## 2017-09-15 DIAGNOSIS — M79641 Pain in right hand: Secondary | ICD-10-CM | POA: Diagnosis not present

## 2017-09-15 DIAGNOSIS — M65311 Trigger thumb, right thumb: Secondary | ICD-10-CM | POA: Insufficient documentation

## 2017-09-15 MED ORDER — DICLOFENAC SODIUM 1 % TD GEL: 2.0000 g | Freq: Four times a day (QID) | TRANSDERMAL | 11 refills | Status: DC

## 2017-09-15 NOTE — Progress Notes (Signed)
Subjective:    I'm seeing this patient as a consultation for:  Jonathan Dredge, PA-C   CC: Right shoulder pain, right forearm pain and right hand pain  HPI:  [name] is a RHD male with a 1 month history of pain in the right shoulder.  He notes the pain is located in the right lateral upper arm and is worse with overhead motion reaching back and at night.  He notes pain is also worse in activities.  He has not really tried much exercise yet.  He is tried using Aleve which does help some.  He denies fevers or chills nausea vomiting or diarrhea.  Additionally he notes some pain in his right forearm.  He points to the area just distal to the lateral epicondyle and the extensor muscle belly of the forearm.  He notes pain is worse with extension based activities and better with rest.  He notes the pain does radiate a bit down the dorsal forearm to the wrist.  He denies any volar pain or pain radiating to the hand.  Additionally he notes cramping pain in the right thenar area.  He notes the area in the interdigital webspace between the thumb and the index finger ramps spasms becomes painful with heavy duty hand activity.  Both the forearm pain and hand pain have been ongoing for about 2 weeks.  He notes the hand and forearm symptoms have responded a bit as well to Aleve but again he denies any specific treatment for these issues.  Past medical history, Surgical history, Family history not pertinant except as noted below, Social history, Allergies, and medications have been entered into the medical record, reviewed, and no changes needed.   Review of Systems: No headache, visual changes, nausea, vomiting, diarrhea, constipation, dizziness, abdominal pain, skin rash, fevers, chills, night sweats, weight loss, swollen lymph nodes, body aches, joint swelling, muscle aches, chest pain, shortness of breath, mood changes, visual or auditory hallucinations.   Objective:    Vitals:   09/15/17  0809  BP: 99/62  Pulse: 76  SpO2: 98%   General: Well Developed, well nourished, and in no acute distress.  Neuro/Psych: Alert and oriented x3, extra-ocular muscles intact, able to move all 4 extremities, sensation grossly intact. Skin: Warm and dry, no rashes noted.  Respiratory: Not using accessory muscles, speaking in full sentences, trachea midline.  Cardiovascular: Pulses palpable, no extremity edema. Abdomen: Does not appear distended. MSK:  Right shoulder normal-appearing nontender. Normal range of motion with palpable crepitations present. Mildly positive Hawkins and Neer's test. Mildly positive empty can test. Negative crossover arm compression test. Negative relocation and clunk test. Strength is intact to all range of motion activities  Right elbow normal-appearing with normal range of motion. Nontender at lateral epicondyle. Mildly tender to palpation in the extensor muscle belly tendon. Pain with resisted wrist and finger extension however strength is intact.  Right hand normal-appearing nontender normal motion.  No triggering present.  Strength is intact.  Pulses capillary refill and sensation are intact.  Contralateral left shoulder elbow and hand and wrist normal-appearing nontender normal motion normal strength normal pulses capillary refill and sensation.    Impression and Recommendations:    Assessment and Plan: 52 y.o. male with  Right shoulder pain rotator cuff tendinopathy likely.  Discussed treatment options.  Plan for dedicated home exercise program and diclofenac gel.  Next step would be subacromial bursa injection if not better.  Recheck in 6 weeks.  Right forearm pain pain and  soreness in the forearm extensor compartment.  This is possibly radial tunnel syndrome however I think it is more muscle overuse and soreness.  Plan on eccentric exercises and stretching program as well as diclofenac gel.  Recheck in 6 weeks.  Right hand pain and cramping of the  interdigital webspace likely overuse.  Discussed some home exercise and stretching program.  Recheck 6 weeks.   Meds ordered this encounter  Medications  . diclofenac sodium (VOLTAREN) 1 % GEL    Sig: Apply 2 g topically 4 (four) times daily. To affected joint.    Dispense:  100 g    Refill:  11    Discussed warning signs or symptoms. Please see discharge instructions. Patient expresses understanding.

## 2017-09-15 NOTE — Patient Instructions (Signed)
Thank you for coming in today. We will try the home exercises.  Go up to the front, up to the side, internal rotation, and external rotation Do the forearm stretch and strength program.   Recheck in 4-6 weeks return sooner if needed  Use the diclofenac gel as needed.

## 2017-09-16 ENCOUNTER — Ambulatory Visit (INDEPENDENT_AMBULATORY_CARE_PROVIDER_SITE_OTHER): Payer: PRIVATE HEALTH INSURANCE

## 2017-09-16 DIAGNOSIS — R918 Other nonspecific abnormal finding of lung field: Secondary | ICD-10-CM

## 2017-09-16 DIAGNOSIS — I2510 Atherosclerotic heart disease of native coronary artery without angina pectoris: Secondary | ICD-10-CM | POA: Diagnosis not present

## 2017-09-17 ENCOUNTER — Encounter: Payer: Self-pay | Admitting: Physician Assistant

## 2017-09-17 ENCOUNTER — Other Ambulatory Visit: Payer: Self-pay | Admitting: Physician Assistant

## 2017-09-17 DIAGNOSIS — I2510 Atherosclerotic heart disease of native coronary artery without angina pectoris: Secondary | ICD-10-CM

## 2017-09-17 DIAGNOSIS — I2584 Coronary atherosclerosis due to calcified coronary lesion: Secondary | ICD-10-CM | POA: Insufficient documentation

## 2017-09-17 DIAGNOSIS — I2583 Coronary atherosclerosis due to lipid rich plaque: Principal | ICD-10-CM

## 2017-09-17 HISTORY — DX: Atherosclerotic heart disease of native coronary artery without angina pectoris: I25.10

## 2017-09-17 MED ORDER — ASPIRIN EC 81 MG PO TBEC: 81.0000 mg | DELAYED_RELEASE_TABLET | Freq: Every day | ORAL | 3 refills | Status: AC

## 2017-09-17 NOTE — Progress Notes (Signed)
Lung nodule has resolved. No additional imaging needed There is evidence of atherosclerosis in the blood vessels of the heart. We have started him on Atorvastatin to treat this. I would also like him to start baby aspirin 81 mg daily  If he develops any chest pain, follow-up in office or go to nearest ER asap

## 2017-09-19 ENCOUNTER — Encounter: Payer: PRIVATE HEALTH INSURANCE | Admitting: Physician Assistant

## 2017-12-23 LAB — CBC
HEMATOCRIT: 45.5 % (ref 38.5–50.0)
Hemoglobin: 15.8 g/dL (ref 13.2–17.1)
MCH: 29.8 pg (ref 27.0–33.0)
MCHC: 34.7 g/dL (ref 32.0–36.0)
MCV: 85.8 fL (ref 80.0–100.0)
MPV: 9.3 fL (ref 7.5–12.5)
Platelets: 246 10*3/uL (ref 140–400)
RBC: 5.3 10*6/uL (ref 4.20–5.80)
RDW: 12.7 % (ref 11.0–15.0)
WBC: 8.1 10*3/uL (ref 3.8–10.8)

## 2017-12-23 LAB — COMPREHENSIVE METABOLIC PANEL
AG Ratio: 1.6 (calc) (ref 1.0–2.5)
ALBUMIN MSPROF: 4.4 g/dL (ref 3.6–5.1)
ALKALINE PHOSPHATASE (APISO): 76 U/L (ref 40–115)
ALT: 26 U/L (ref 9–46)
AST: 23 U/L (ref 10–35)
BILIRUBIN TOTAL: 0.7 mg/dL (ref 0.2–1.2)
BUN: 18 mg/dL (ref 7–25)
CALCIUM: 9.2 mg/dL (ref 8.6–10.3)
CHLORIDE: 104 mmol/L (ref 98–110)
CO2: 27 mmol/L (ref 20–32)
Creat: 0.74 mg/dL (ref 0.70–1.33)
GLOBULIN: 2.7 g/dL (ref 1.9–3.7)
Glucose, Bld: 90 mg/dL (ref 65–99)
POTASSIUM: 4 mmol/L (ref 3.5–5.3)
Sodium: 138 mmol/L (ref 135–146)
Total Protein: 7.1 g/dL (ref 6.1–8.1)

## 2017-12-23 LAB — LIPID PANEL W/REFLEX DIRECT LDL
CHOLESTEROL: 253 mg/dL — AB (ref <200)
HDL: 57 mg/dL (ref >40)
LDL Cholesterol (Calc): 173 mg/dL (calc) — ABNORMAL HIGH
Non-HDL Cholesterol (Calc): 196 mg/dL (calc) — ABNORMAL HIGH (ref <130)
Total CHOL/HDL Ratio: 4.4 (calc) (ref <5.0)
Triglycerides: 106 mg/dL (ref <150)

## 2017-12-23 LAB — PSA: PSA: 1.1 ng/mL (ref <=4.0)

## 2017-12-23 LAB — HEMOGLOBIN A1C
HEMOGLOBIN A1C: 5.4 %{Hb} (ref <5.7)
Mean Plasma Glucose: 108 (calc)
eAG (mmol/L): 6 (calc)

## 2018-06-26 IMAGING — CT CT CHEST W/O CM
2 of 3 series · 15 of 36 positions shown, 18 images · non-contrast
Comparison: 12/04/2016 chest radiograph.  CT 09/26/2016.

CLINICAL DATA: Follow-up of pulmonary nodules. Nonsmoker.
Asymptomatic. History of pneumonia.

EXAM:
CT CHEST WITHOUT CONTRAST
TECHNIQUE: Multidetector CT imaging of the chest was performed following the
standard protocol without IV contrast.

[Series 2: thorax · axial · 0.74mm/px · z∈[-308,-53]mm · 12 of 61 slices shown, 15 images]
[im 5/61  mediastinal]
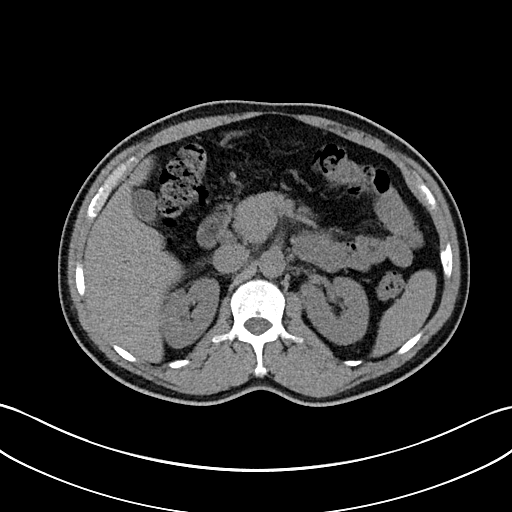
[im 5/61  lung]
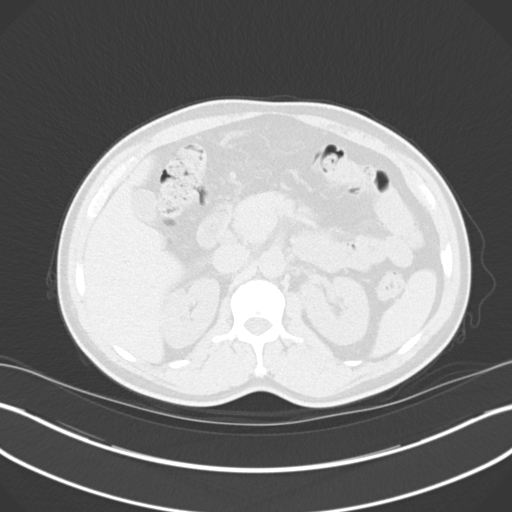
[im 9/61  lung]
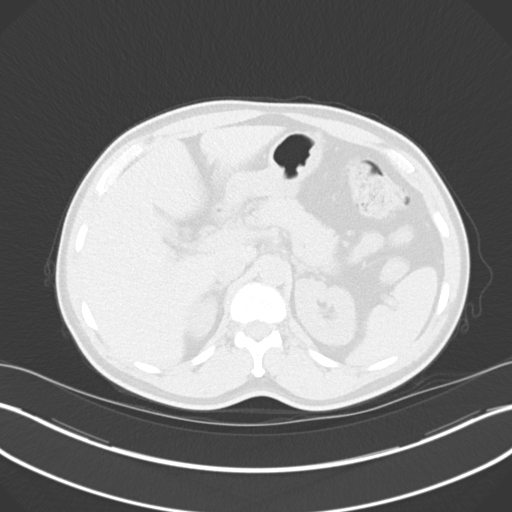
[im 14/61  lung]
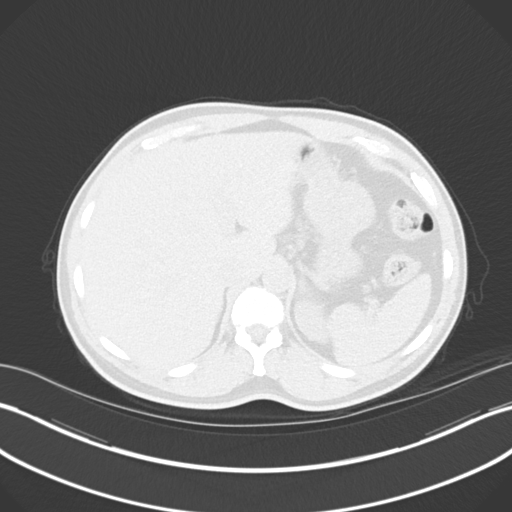
[im 18/61  lung]
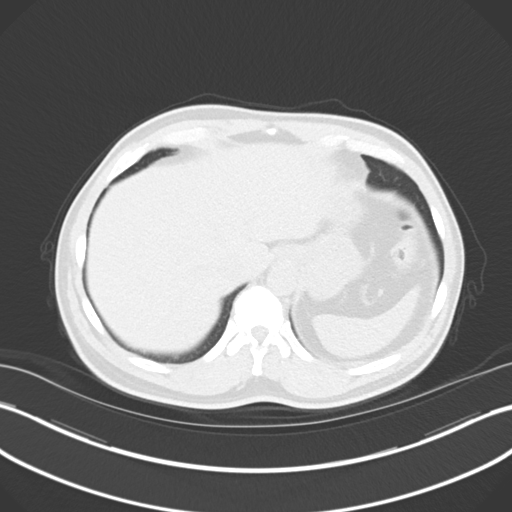
[im 23/61  mediastinal]
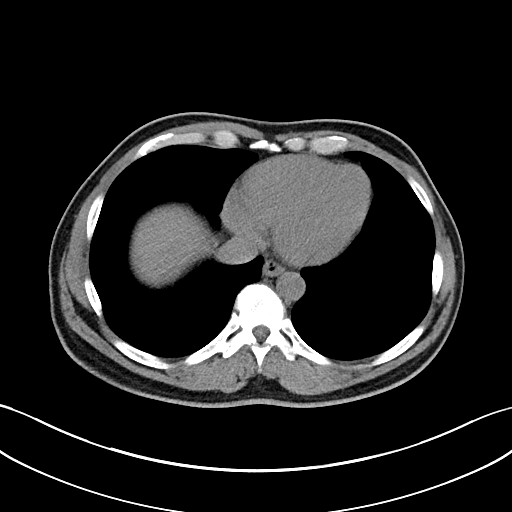
[im 23/61  lung]
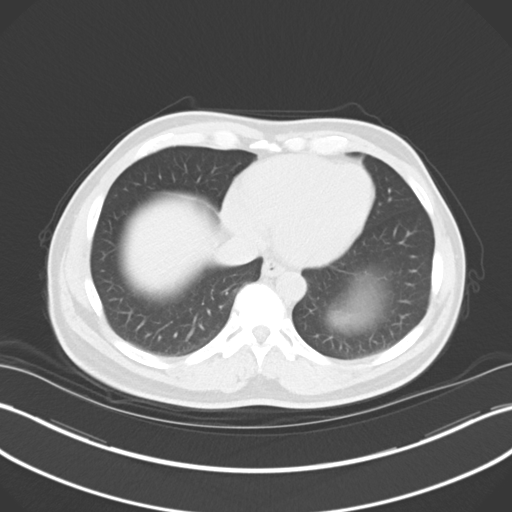
[im 27/61  lung]
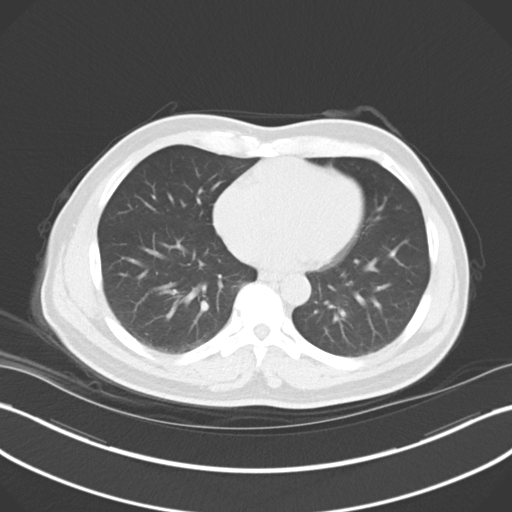
[im 34/61  lung]
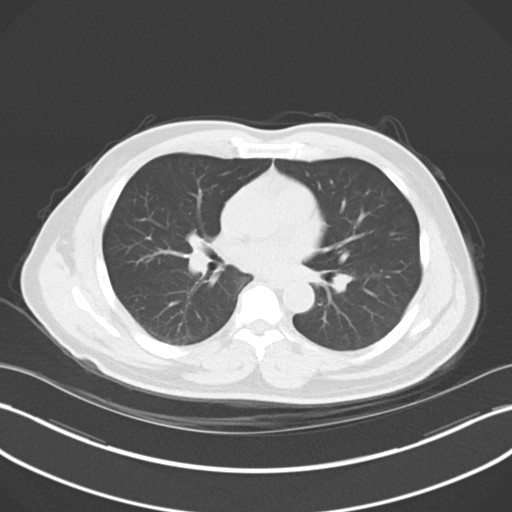
[im 38/61  lung]
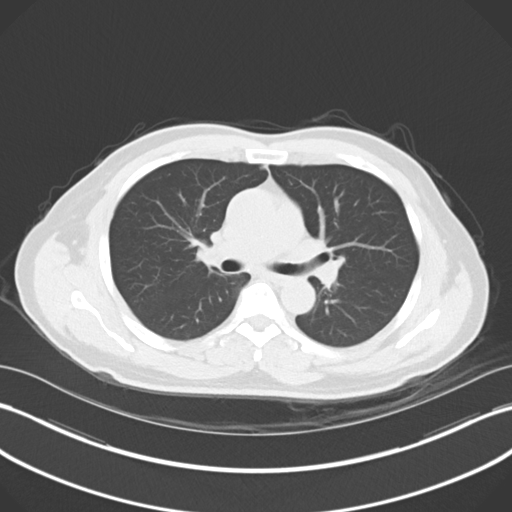
[im 43/61  mediastinal]
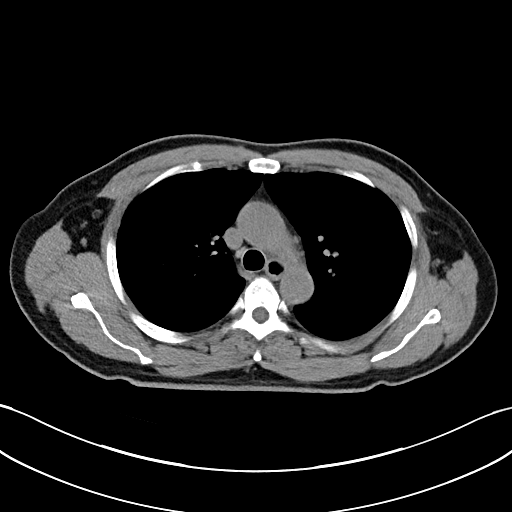
[im 43/61  lung]
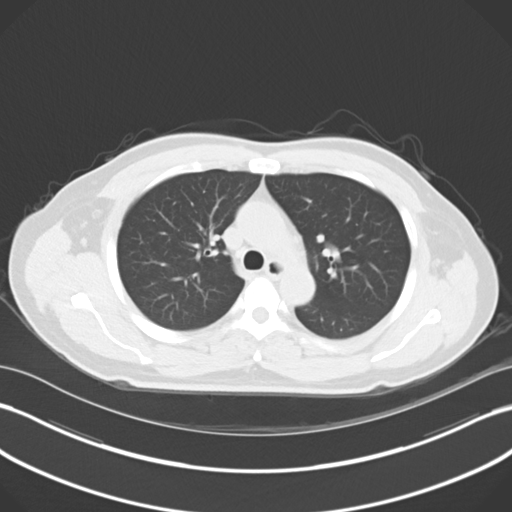
[im 47/61  lung]
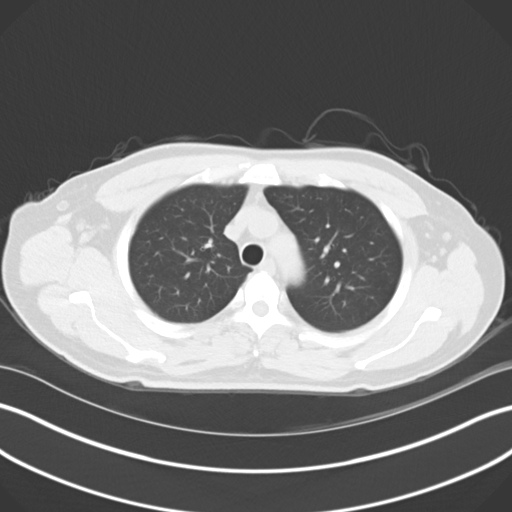
[im 52/61  lung]
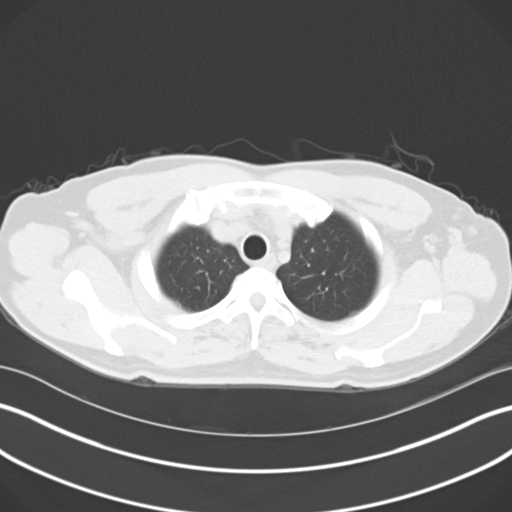
[im 56/61  lung]
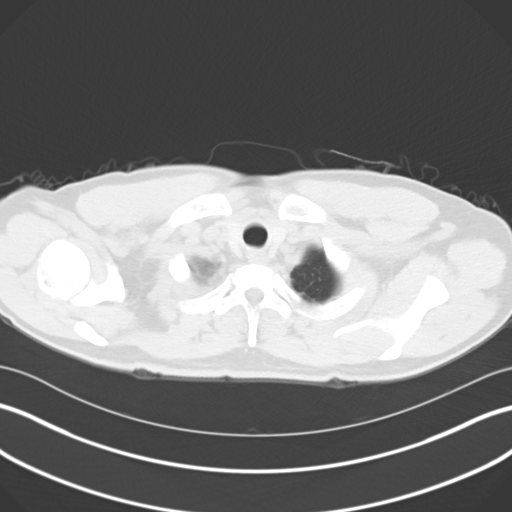

[Series 5: coronal · coronal · 0.62mm/px · 3 of 115 slices shown]
[im 23/115  lung]
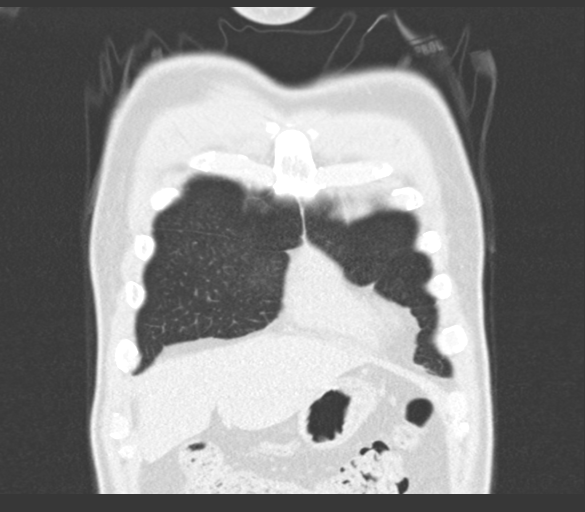
[im 46/115  lung]
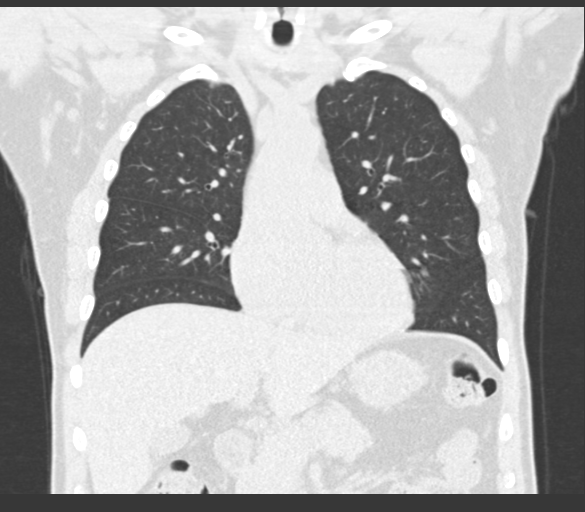
[im 69/115  lung]
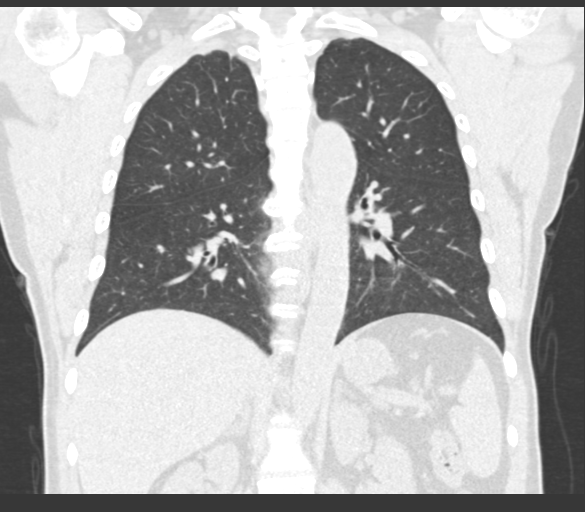

[15 of 36 positions shown; findings below may reference images not displayed]

FINDINGS: Cardiovascular: Normal caliber of the aorta and branch vessels.
Normal heart size, without pericardial effusion. Lad coronary artery
calcification is subtle including on image [DATE] and image 46/5.

Mediastinum/Nodes: No mediastinal or definite hilar adenopathy,
given limitations of unenhanced CT.

Lungs/Pleura: No pleural fluid. The right middle lobe clustered
nodules described on the prior exam have resolved.

The lingular 4 mm nodule described on the prior exam is also no
longer identified.

Upper Abdomen: Normal imaged portions of the liver, spleen, stomach,
pancreas, gallbladder, adrenal glands, kidneys.

Musculoskeletal: No acute osseous abnormality.
IMPRESSION: 1. Interval resolution of clustered right middle lobe
infectious/inflammatory nodules. The left upper lobe 4 mm nodule is
also no longer identified.
2. Age advanced coronary artery atherosclerosis. Recommend
assessment of coronary risk factors and consideration of medical
therapy.

## 2018-09-24 ENCOUNTER — Encounter: Payer: Self-pay | Admitting: Physician Assistant

## 2018-09-24 ENCOUNTER — Ambulatory Visit (INDEPENDENT_AMBULATORY_CARE_PROVIDER_SITE_OTHER): Payer: PRIVATE HEALTH INSURANCE | Admitting: Physician Assistant

## 2018-09-24 VITALS — BP 103/72 | HR 84 | Temp 98.1°F | Wt 130.0 lb

## 2018-09-24 DIAGNOSIS — E782 Mixed hyperlipidemia: Secondary | ICD-10-CM | POA: Diagnosis not present

## 2018-09-24 DIAGNOSIS — Z0000 Encounter for general adult medical examination without abnormal findings: Secondary | ICD-10-CM | POA: Diagnosis not present

## 2018-09-24 DIAGNOSIS — Z131 Encounter for screening for diabetes mellitus: Secondary | ICD-10-CM

## 2018-09-24 DIAGNOSIS — Z5320 Procedure and treatment not carried out because of patient's decision for unspecified reasons: Secondary | ICD-10-CM

## 2018-09-24 DIAGNOSIS — I2510 Atherosclerotic heart disease of native coronary artery without angina pectoris: Secondary | ICD-10-CM | POA: Diagnosis not present

## 2018-09-24 DIAGNOSIS — I2584 Coronary atherosclerosis due to calcified coronary lesion: Secondary | ICD-10-CM

## 2018-09-24 DIAGNOSIS — Z1389 Encounter for screening for other disorder: Secondary | ICD-10-CM

## 2018-09-24 DIAGNOSIS — Z130 Encounter for screening for diseases of the blood and blood-forming organs and certain disorders involving the immune mechanism: Secondary | ICD-10-CM

## 2018-09-24 NOTE — Patient Instructions (Signed)
Arteriopata coronaria en hombres Coronary Artery Disease, Male La arteriopata coronaria [facility]) es una afeccin por la cual las arterias que llegan al corazn (arterias coronarias) se Investment banker, corporate u obstruyen. El Public librarian u obstruccin puede conducir a una disminucin del flujo sanguneo al corazn. La reduccin prolongada del flujo sanguneo puede causar un ataque cardaco (infarto de miocardio o IM). Esta afeccin tambin es conocida como enfermedad cardaca. Debido a que la CA es la causa principal de [employer] hombres, es importante entender lo que causa esta afeccin y el modo de tratarla. Cules son las causas? La AC a menudo est causada por aterosclerosis. Es Software engineer, la acumulacin de grasa y colesterol (placa) en la parte interna de las arterias. Con el paso del [place], la placa puede Psychiatric nurse u obstruir la arteria, y de Development worker, international aid reducir el flujo sanguneo hacia el corazn. La placa tambin puede debilitarse y desprenderse dentro de una arteria coronaria y causar una obstruccin repentina. Otras causas menos frecuentes de AC incluyen lo siguiente:  Un cogulo de [place] o un fragmento de un cogulo de sangre u otra sustancia que obstruye el flujo de sangre en una arteria coronaria (embolia).  Una ruptura de la arteria (diseccin espontnea de arterias coronarias).  Agrandamiento de una arteria (aneurisma).  Inflamacin (vasculitis) en la pared de la arteria. Qu incrementa el riesgo? Los siguientes factores pueden hacer que sea ms propenso a Armed forces training and education officer afeccin:  Edad. Los [employer] de 43aos corren ms riesgo de sufrir [place].  Antecedentes familiares de arteriopata coronaria.  Sexo. A menudo, los hombres presentan AC antes que las mujeres.  Presin arterial alta (hipertensin arterial).  Diabetes.  Niveles elevados de colesterol.  Consumo de tabaco.  Consumo excesivo de bebidas alcohlicas.  Falta de actividad fsica.  Una dieta con alto  contenido de grasas trans y saturadas, como alimentos fritos y carne procesada. Entre otros factores de riesgo posibles se incluyen los siguientes:  Niveles elevados de estrs.  Depresin.  Obesidad.  Apnea del sueo. Cules son los signos o los sntomas? Muchas personas no presentan sntomas durante las etapas iniciales de la AC. A medida que la enfermedad avanza, los sntomas pueden incluir lo siguiente:  Tourist information centre manager (angina). El dolor: ? Puede ser un dolor contundente u opresivo, o como una sensacin de opresin, presin, distensin o Production designer, theatre/television/film. ? Puede durar algunos minutos o puede detenerse y Stage manager. Tiende a agudizarse con el ejercicio o el estrs y desaparece con el reposo.  Dolor en los brazos, el cuello, la [place], el odo o la espalda.  Acidez estomacal o indigestin sin causa aparente.  Falta de aire.  Nuseas o vmitos.  Sensacin repentina de desvanecimiento.  Sudor fro repentino.  Latidos cardacos rpidos o como un aleteo (palpitaciones). Cmo se diagnostica? Esta afeccin se diagnostica en funcin de lo siguiente:  Sus antecedentes mdicos y familiares.  Un examen fsico.  Otras pruebas que incluyen: ? Un estudio para Building services engineer (electrocardiograma). ? Ergometra. Busca signos de obstruccin cuando el corazn est realizando mucho esfuerzo debido al ejercicio, como correr o Lawyer cinta. ? Systems analyst. Se utiliza para detectar signos de obstruccin cuando el corazn se somete a IT consultant uso de un medicamento. ? Anlisis de [place]. ? Angiograma coronario. En este procedimiento se detecta si hay obstruccin en las arterias coronarias. Durante este estudio, se inyecta un tinte de contraste en las arterias para que se vean en la radiografa. ?  Exploracin por tomografa computarizada (TC) de la arteria coronaria. Esta exploracin por tomografa computarizada (TC) ayuda a  detectar depsitos de calcio en las arterias coronarias. Los depsitos de calcio son un indicador de [place]. ? Un estudio que toma una imagen del corazn mediante el uso de ondas sonoras (ecocardiograma). ? Radiografa de trax. Cmo se trata? El tratamiento para esta afeccin puede incluir lo siguiente:  Hacer modificaciones saludables en el estilo de vida para reducir los factores de [place].  Medicamentos tales como los siguientes: ? Medicamentos antiplaquetarios y anticoagulantes, como la aspirina. Estos ayudan a Mining engineer formacin de cogulos sanguneos. ? Nitroglicerina. ? Medicamentos para la presin arterial. ? Medicamentos para Advertising copywriter.  Angioplastia coronaria y colocacin de stents. Durante este procedimiento, se inserta un tubo delgado y flexible a travs de un vaso sanguneo hasta una arteria obstruida. En el extremo del tubo se infla un baln o dispositivo similar para abrir la arteria. En algunos casos, se inserta un tubo pequeo de malla (stent) dentro de la arteria para mantenerla abierta.  Ciruga de revascularizacin de la arteria coronaria. Durante esta ciruga, se usan venas y arterias de otras partes del cuerpo para crear un desvo (bypass) alrededor de la obstruccin y permitir que la sangre llegue al corazn. Siga estas instrucciones en su casa: Medicamentos  Tome los medicamentos de venta libre y los recetados solamente como se lo haya indicado el mdico.  No tome los siguientes medicamentos a menos que el mdico lo autorice: ? Antiinflamatorios no esteroideos (AINE), como ibuprofeno, naproxeno o celecoxib. ? Suplementos vitamnicos que contienen vitamina A, vitamina E o ambas. Estilo de vida  Siga un programa de actividad fsica aprobado por el mdico. Trate de hacer 150 minutos de actividad fsica moderada o 75 minutos de actividad fsica intensa por semana.  Mantenga un peso saludable o pierda peso segn se lo haya indicado su mdico.  Aprenda a  manejar el estrs o trate de limitarlo. Pida ayuda al mdico si es necesario.  Hgase estudios para Hydrographic surveyor depresin y busque tratamiento, si fuera necesario.  No consuma ningn producto que contenga nicotina o tabaco, como cigarrillos, cigarrillos electrnicos y tabaco de Higher education careers adviser. Si necesita ayuda para dejar de fumar, consulte al mdico.  No consuma drogas ilegales. Comida y bebida   Siga una dieta cardiosaludable. Un nutricionista puede ensearle sobre opciones de alimentos saludables y los cambios correspondientes. En general, ingiera muchas frutas y verduras, carnes magras y cereales integrales.  Evite los alimentos con alto contenido de: ? Azcar. ? Sal (sodio). ? Grasa saturada, como carnes procesadas o con grasa. ? Grasa trans, como alimentos fritos.  Use mtodos de coccin saludables, como asar, Interior and spatial designer, hervir, hornear, escalfar, cocer al vapor o saltear.  No beba alcohol si el mdico se lo prohbe.  Si bebe alcohol: ? Limite la cantidad que consume de 0 a 2 medidas por da. ? Est atento a la cantidad de alcohol que hay en las bebidas que toma. En los [place], una medida equivale a una botella de cerveza de 12oz (313ml), un vaso de vino de 5oz (144ml) o un vaso de una bebida alcohlica de alta graduacin de 1oz (10ml). Instrucciones generales  Controle cualquier otra afeccin de la salud, como hipertensin y diabetes. Estas afecciones afectan el corazn.  El mdico puede pedirle que controle su presin arterial. En condiciones ideales, la presin arterial debe estar por debajo de 130/80.  Concurra a todas las visitas de seguimiento como se lo haya indicado el mdico. Esto  es importante. Solicite ayuda inmediatamente si:  [employer], el cuello, el odo, el brazo, la [place], el estmago o la espalda que: ? Dura ms de unos minutos. ? Es recurrente. ? No se alivia despus de tomar medicamentos sublinguales (nitroglicerinasublingual).   Tiene sudoraciones intensas sin causa.  Tiene estos sntomas sin causa aparente: ? Acidez estomacal o indigestin. ? Falta de [place] para respirar. ? Latidos cardacos rpidos o como un aleteo (palpitaciones). ? Nuseas o vmitos. ? Fatiga. ? Nerviosismo o ansiedad. ? Debilidad. ? Diarrea.  Mareos o sensacin de desvanecimiento repentinos.  Se desmaya.  Quiere hacerse dao o piensa en quitarse la vida. Estos sntomas pueden representar un problema grave que constituye Engineer, maintenance (IT). No espere a ver si los sntomas desaparecen. Solicite atencin mdica de inmediato. Comunquese con el servicio de emergencias de su localidad (911 en los [place]). No conduzca por sus propios medios [employer] hospital. Resumen  La arteriopata [facility]) es una afeccin por la cual las arterias que llegan al corazn (arterias coronarias) se Investment banker, corporate u obstruyen. El Public librarian u obstruccin puede conducir a un infarto de miocardio.  Muchas personas no presentan sntomas durante las etapas iniciales de la AC.  La [employer] se puede tratar con [employer] de vida, medicamentos, [place] o una combinacin de [place]. Esta informacin no tiene Marine scientist el consejo del mdico. Asegrese de hacerle al mdico cualquier pregunta que tenga. Document Released: 10/06/2013 Document Revised: 12/19/2017 Document Reviewed: 12/19/2017 Elsevier Patient Education  2020 [place] y el corazn Aspirin and Your Heart  La aspirina es un medicamento que evita que las clulas de la sangre que se usan para la coagulacin, denominadas plaquetas, se peguen. Puede utilizarse para reducir Catering manager de cogulos sanguneos, ataques cardacos y otros problemas relacionados con el corazn. Puedo tomar aspirina? El mdico lo ayudar a Teacher, adult education si es seguro y beneficioso para usted tomar una aspirina a diario. Tomar una aspirina a diario puede ayudarlo si usted:  Tuvo un infarto  de miocardio o dolor de pecho.  Tiene riesgo de sufrir un infarto de miocardio.  Le realizaron una ciruga a corazn abierto, como ciruga de injerto de revascularizacin arterial coronaria (IRAC).  Le realizaron una angioplastia coronaria o le colocaron un stent.  Ha tenido ciertos tipos de accidente cerebrovascular o un accidente isqumico transitorio (AIT).  Tiene enfermedad arterial perifrica (EAP).  Tiene problemas de ritmo cardaco crnicos, como fibrilacin auricular y no puede tomar un anticoagulante.  Tiene una enfermedad de las vlvulas cardacas o ha tenido [place] en una vlvula. Cules son los riesgos? El consumo diario de aspirina puede causar efectos secundarios. Entre ellos se incluyen los siguientes:  Hemorragia. Problemas de hemorragias, que pueden ser [employer] o graves. Por ejemplo, un problema menor es un corte que no deja de Therapist, art; uno ms grave es Air cabin crew o, en casos poco frecuentes, una hemorragia cerebral. El riesgo de hemorragias aumenta si tambin toma frmacos antiinflamatorios no esteroideos (AINE).  Aumento de la formacin de hematomas.  Malestar estomacal.  Una reaccin alrgica. Las personas con plipos nasales corren un riesgo mayor de tener alergia a la aspirina. Pautas generales  Tome aspirina nicamente como se lo haya indicado el mdico. Asegrese de que comprende cunto y cul presentacin debe tomar. Las dos presentaciones de la aspirina son: ? No gastrorresistente.Este tipo de aspirina no es [place] y se absorbe rpidamente. Este tipo de aspirina tambin est disponible como  comprimido masticable. ? Gastrorresistente. Este tipo de aspirina tiene un recubrimiento que libera el medicamento muy lentamente. La aspirina gastrorresistente podra causar menos malestar estomacal en comparacin con la opcin que no es Technical sales engineer. Este tipo de aspirina no debe masticarse ni triturarse.  Limite el consumo de alcohol a no ms  de 73medida por da si es mujer y no est [place], y a 72medidas por da si es hombre. Beber alcohol aumenta el riesgo de Art gallery manager. Una medida equivale a 12oz (351ml) de cerveza, 5oz (125ml) de vino o 1oz (80ml) de bebidas alcohlicas de alta graduacin. Comunquese con un mdico si:  Tiene hemorragias o moretones inusuales.  Tiene dolor de estmago o nuseas.  Tiene zumbidos en los odos.  Tiene una reaccin alrgica que causa: ? [place]. ? Picazn en la piel. ? Hinchazn de los labios, la lengua o el rostro. Solicite ayuda inmediatamente si:  Nota que su materia fecal tiene [place], es de color rojo oscuro o negro.  Vomita o tose con sangre.  Tiene sangre en la orina.  Tose, tiene una respiracin ruidosa (sibilancias) o le falta el aire.  Siente dolor en el pecho, especialmente si el dolor se extiende [employer], la espalda, el cuello o la [place].  Tiene un dolor de cabeza intenso, o presenta confusin o mareos junto con el dolor de [place]. Estos sntomas pueden representar un problema grave que constituye Engineer, maintenance (IT). No espere hasta que los sntomas desaparezcan. Solicite atencin mdica de inmediato. Comunquese con el servicio de emergencias de su localidad (911 en los [place]). No conduzca por sus propios medios [employer] Financial. Resumen  Puede utilizarse para reducir el riesgo de cogulos sanguneos, ataques cardacos y otros problemas relacionados con el corazn.  El consumo diario de aspirina puede aumentar el riesgo de efectos secundarios. El mdico lo ayudar a Teacher, adult education si es seguro y beneficioso para usted tomar una aspirina a diario.  Tome aspirina nicamente como se lo haya indicado el mdico. Asegrese de que comprende cunto y cul presentacin puede tomar. Esta informacin no tiene Marine scientist el consejo del mdico. Asegrese de hacerle al mdico cualquier pregunta que tenga. Document Released: 06/07/2008 Document  Revised: 06/20/2017 Document Reviewed: 03/03/2017 Elsevier Patient Education  2020 [employer].

## 2018-09-24 NOTE — Progress Notes (Signed)
HPI:                                                                Jonathan Mclaughlin is a 53 y.o. male who presents to [facility] [place]: [facility] today for annual physical exam  Current Concerns include: none  Patient was found to have a slight coronary calcification of the LAD on CT chest last year.  He also has hyperlipidemia.  He self discontinued his statin nearly a year ago.  He states he does not feel he really needs it because he does not have any chest pains.  Reports he has been cooking more meals at home and is very physically active at his job.  He denies change in exercise tolerance, dyspnea on exertion, chest pain on exertion, orthopnea, edema. Denies family history of heart disease. Non-smoker.  Health Maintenance Health Maintenance  Topic Date Due  . HIV Screening  12/22/1978  . INFLUENZA VACCINE  10/24/2018  . COLONOSCOPY  12/09/2019  . TETANUS/TDAP  03/25/2021  . Hepatitis C Screening  Completed    Depression screen [facility] 2/9 09/24/2018 09/10/2017  Decreased Interest 0 0  Down, Depressed, Hopeless 0 0  PHQ - 2 Score 0 0   Fall Risk  09/24/2018  Falls in the past year? 0    Past Medical History:  Diagnosis Date  . Aspiration pneumonia ([place]) 09/27/2016  . CAD (coronary artery disease) 09/17/2017  . History of colonic polyps 01/12/2015   Repeat 11/2019   . Hyperlipidemia    Past Surgical History:  Procedure Laterality Date  . COLONOSCOPY W/ POLYPECTOMY    . WISDOM TOOTH EXTRACTION     Social History   Tobacco Use  . Smoking status: Never Smoker  . Smokeless tobacco: Never Used  Substance Use Topics  . Alcohol use: No    Alcohol/week: 0.0 standard drinks   family history includes Alcohol abuse in his father; Arthritis in his mother.  ROS: negative except as noted in the HPI  Medications: Current Outpatient Medications  Medication Sig Dispense Refill  . aspirin EC 81 MG tablet Take 1 tablet (81 mg total) by mouth daily.  (Patient not taking: Reported on 09/24/2018) 90 tablet 3  . atorvastatin (LIPITOR) 20 MG tablet Take 1 tablet (20 mg total) by mouth daily. (Patient not taking: Reported on 09/24/2018) 90 tablet 3  . diclofenac sodium (VOLTAREN) 1 % GEL Apply 2 g topically 4 (four) times daily. To affected joint. (Patient not taking: Reported on 09/24/2018) 100 g 11   No current facility-administered medications for this visit.    No Known Allergies     Objective:  BP 103/72   Pulse 84   Temp 98.1 F (36.7 C) (Oral)   Wt 130 lb (59 kg)   BMI 23.77 kg/m  General Appearance:  Alert, cooperative, no distress, appropriate for age                            Head:  Normocephalic, without obvious abnormality                             Eyes:  PERRL, EOM's intact, conjunctiva and cornea clear,  wearing glasses                             Ears:  TM pearly gray color and semitransparent, external ear canals normal, both ears                            Nose:  Nares symmetrical, mucosa pink                          Throat:  Lips, tongue, and mucosa are moist, pink, and intact; oropharynx clear, uvula midline; good dentition                             Neck:  Supple; symmetrical, trachea midline, no adenopathy; thyroid: no enlargement, symmetric, no tenderness/mass/nodules                             Back:  Symmetrical, no curvature, ROM normal                                      Lungs:  Clear to auscultation bilaterally, respirations unlabored                             Heart:  normal rate & regular rhythm, S1 and S2 normal, no murmurs, rubs, or gallops                     Abdomen:  Soft, non-tender, no mass or organomegaly              Genitourinary:  deferred         Musculoskeletal:  Tone and strength strong and symmetrical, all extremities; no joint pain or edema, normal gait and station                                Lymphatic:  No adenopathy             Skin/Hair/Nails:  Skin warm, dry and intact, no rashes or  abnormal dyspigmentation on limited exam                   Neurologic:  Alert and oriented x3, no cranial nerve deficits, sensation grossly intact, normal gait and station, no tremor Psych: well-groomed, cooperative, good eye contact, euthymic mood, affect mood-congruent, speech is articulate, and thought processes clear and goal-directed  Lab Results  Component Value Date   CREATININE 0.74 09/10/2017   BUN 18 09/10/2017   NA 138 09/10/2017   K 4.0 09/10/2017   CL 104 09/10/2017   CO2 27 09/10/2017   Lab Results  Component Value Date   ALT 26 09/10/2017   AST 23 09/10/2017   ALKPHOS 82 09/19/2016   BILITOT 0.7 09/10/2017   Lab Results  Component Value Date   WBC 8.1 09/10/2017   HGB 15.8 09/10/2017   HCT 45.5 09/10/2017   MCV 85.8 09/10/2017   PLT 246 09/10/2017   Lab Results  Component Value Date   CHOL 253 (H) 09/10/2017   HDL 57 09/10/2017   LDLCALC 173 (H) 09/10/2017  TRIG 106 09/10/2017   CHOLHDL 4.4 09/10/2017   The 10-year ASCVD risk score Mikey Bussing DC Jr., et al., 2013) is: 4.1%   Values used to calculate the score:     Age: 80 years     Sex: Male     Is Non-Hispanic African American: No     Diabetic: No     Tobacco smoker: No     Systolic Blood Pressure: 161 mmHg     Is BP treated: No     HDL Cholesterol: 57 mg/dL     Total Cholesterol: 253 mg/dL  No results found for this or any previous visit (from the past 72 hour(s)). No results found.    Assessment and Plan: 53 y.o. male with   .Loy was seen today for annual exam.  Diagnoses and all orders for this visit:  Encounter for annual physical exam -     COMPLETE METABOLIC PANEL WITH GFR -     Lipid Panel w/reflex Direct LDL -     CBC -     Urinalysis, Routine w reflex microscopic  Coronary atherosclerosis due to calcified coronary lesion -     Lipid Panel w/reflex Direct LDL  Mixed hyperlipidemia -     Lipid Panel w/reflex Direct LDL  Statin declined  Screening for diabetes mellitus -      COMPLETE METABOLIC PANEL WITH GFR  Screening for blood disease -     CBC  Screening for blood or protein in urine -     Urinalysis, Routine w reflex microscopic   - Personally reviewed PMH, PSH, PFH, medications, allergies, HM - Age-appropriate cancer screening: colonoscopy UTD, baseline PSA UTD - Tdap UTD - PHQ2 negative - Fall screen negative - Routine fasting labs pending  10-year atherosclerotic cardiovascular disease risk 4.1%, known coronary calcification of LAD on prior imaging.  Patient self discontinued statin medication.  Risks and benefits explained to patient in detail today.  He is amenable to taking baby aspirin for primary prevention.  His other modifiable risk factors are well controlled -blood pressure normotensive, non-smoker, healthy BMI   Patient education and anticipatory guidance given Patient agrees with treatment plan Follow-up in 1 year for CPE with fasting labs or sooner as needed  Darlyne Russian PA-C

## 2018-09-25 LAB — COMPLETE METABOLIC PANEL WITH GFR
AG Ratio: 1.7 (calc) (ref 1.0–2.5)
ALT: 43 U/L (ref 9–46)
AST: 26 U/L (ref 10–35)
Albumin: 4.4 g/dL (ref 3.6–5.1)
Alkaline phosphatase (APISO): 69 U/L (ref 35–144)
BUN: 21 mg/dL (ref 7–25)
CO2: 26 mmol/L (ref 20–32)
Calcium: 9.1 mg/dL (ref 8.6–10.3)
Chloride: 105 mmol/L (ref 98–110)
Creat: 0.77 mg/dL (ref 0.70–1.33)
GFR, Est African American: 119 mL/min/{1.73_m2} (ref >=60)
GFR, Est Non African American: 103 mL/min/{1.73_m2} (ref >=60)
Globulin: 2.6 g/dL (calc) (ref 1.9–3.7)
Glucose, Bld: 93 mg/dL (ref 65–99)
Potassium: 3.9 mmol/L (ref 3.5–5.3)
Sodium: 139 mmol/L (ref 135–146)
Total Bilirubin: 0.8 mg/dL (ref 0.2–1.2)
Total Protein: 7 g/dL (ref 6.1–8.1)

## 2018-09-25 LAB — LIPID PANEL W/REFLEX DIRECT LDL
Cholesterol: 246 mg/dL — ABNORMAL HIGH (ref <200)
HDL: 48 mg/dL (ref >=40)
LDL Cholesterol (Calc): 170 mg/dL (calc) — ABNORMAL HIGH
Non-HDL Cholesterol (Calc): 198 mg/dL (calc) — ABNORMAL HIGH (ref <130)
Total CHOL/HDL Ratio: 5.1 (calc) — ABNORMAL HIGH (ref <5.0)
Triglycerides: 139 mg/dL (ref <150)

## 2018-09-25 LAB — CBC
HCT: 46.8 % (ref 38.5–50.0)
Hemoglobin: 16.2 g/dL (ref 13.2–17.1)
MCH: 29.9 pg (ref 27.0–33.0)
MCHC: 34.6 g/dL (ref 32.0–36.0)
MCV: 86.5 fL (ref 80.0–100.0)
MPV: 9.5 fL (ref 7.5–12.5)
Platelets: 256 10*3/uL (ref 140–400)
RBC: 5.41 10*6/uL (ref 4.20–5.80)
RDW: 13.5 % (ref 11.0–15.0)
WBC: 9.6 10*3/uL (ref 3.8–10.8)

## 2018-09-25 LAB — URINALYSIS, ROUTINE W REFLEX MICROSCOPIC
Bilirubin Urine: NEGATIVE
Glucose, UA: NEGATIVE
Hgb urine dipstick: NEGATIVE
Ketones, ur: NEGATIVE
Leukocytes,Ua: NEGATIVE
Nitrite: NEGATIVE
Protein, ur: NEGATIVE
Specific Gravity, Urine: 1.028 (ref 1.001–1.03)
pH: <=5 (ref 5.0–8.0)

## 2019-05-31 ENCOUNTER — Ambulatory Visit (INDEPENDENT_AMBULATORY_CARE_PROVIDER_SITE_OTHER): Payer: PRIVATE HEALTH INSURANCE

## 2019-05-31 ENCOUNTER — Ambulatory Visit (INDEPENDENT_AMBULATORY_CARE_PROVIDER_SITE_OTHER): Payer: PRIVATE HEALTH INSURANCE | Admitting: Sports Medicine

## 2019-05-31 ENCOUNTER — Other Ambulatory Visit: Payer: Self-pay

## 2019-05-31 ENCOUNTER — Encounter: Payer: Self-pay | Admitting: Sports Medicine

## 2019-05-31 DIAGNOSIS — M25512 Pain in left shoulder: Secondary | ICD-10-CM

## 2019-05-31 DIAGNOSIS — M25511 Pain in right shoulder: Secondary | ICD-10-CM

## 2019-05-31 DIAGNOSIS — M19011 Primary osteoarthritis, right shoulder: Secondary | ICD-10-CM | POA: Diagnosis not present

## 2019-05-31 DIAGNOSIS — M65311 Trigger thumb, right thumb: Secondary | ICD-10-CM

## 2019-05-31 MED ORDER — MELOXICAM 15 MG PO TABS: ORAL_TABLET | ORAL | 3 refills | Status: DC

## 2019-05-31 NOTE — Assessment & Plan Note (Signed)
Jonathan Mclaughlin is a pleasant 54 year old male with a 1 month history of bilateral acromioclavicular arthralgia with a positive crossarm sign bilaterally, rotator cuff exam is for the most part unremarkable. We will start conservative, 2 weeks of light duty, meloxicam, bilateral x-rays. Return to see me in a month, injections if no better.

## 2019-05-31 NOTE — Progress Notes (Addendum)
    Procedures performed today:    None.  Independent interpretation of notes and tests performed by another provider:   None.  Impression and Recommendations:    Arthralgia of both acromioclavicular joints Jonathan Mclaughlin is a pleasant 54 year old male with a 1 month history of bilateral acromioclavicular arthralgia with a positive crossarm sign bilaterally, rotator cuff exam is for the most part unremarkable. We will start conservative, 2 weeks of light duty, meloxicam, bilateral x-rays. Return to see me in a month, injections if no better.  Trigger thumb of right hand Mild trigger thumb, palpable nodule at the flexor pollicis longus sheath. This will likely get better with meloxicam, if not we will proceed with flexor pollicis longus tendon sheath injection.    ___________________________________________ Gwen Her. Dianah Field, M.D., ABFM., CAQSM. Primary Care and [place] Instructor of [facility]

## 2019-05-31 NOTE — Assessment & Plan Note (Signed)
Mild trigger thumb, palpable nodule at the flexor pollicis longus sheath. This will likely get better with meloxicam, if not we will proceed with flexor pollicis longus tendon sheath injection.

## 2019-09-21 ENCOUNTER — Encounter: Payer: Self-pay | Admitting: Family Medicine

## 2019-09-21 ENCOUNTER — Ambulatory Visit (INDEPENDENT_AMBULATORY_CARE_PROVIDER_SITE_OTHER): Payer: PRIVATE HEALTH INSURANCE | Admitting: Family Medicine

## 2019-09-21 ENCOUNTER — Other Ambulatory Visit: Payer: Self-pay

## 2019-09-21 VITALS — BP 111/75 | HR 97 | Temp 98.0°F | Wt 138.0 lb

## 2019-09-21 DIAGNOSIS — Z0000 Encounter for general adult medical examination without abnormal findings: Secondary | ICD-10-CM | POA: Diagnosis not present

## 2019-09-21 DIAGNOSIS — E782 Mixed hyperlipidemia: Secondary | ICD-10-CM

## 2019-09-21 NOTE — Patient Instructions (Signed)
Cuidados preventivos en los hombres de 40 a 64 aos de edad Preventive Care 40-56 Years Old, Male Los cuidados preventivos hacen referencia a las opciones en cuanto al estilo de vida y a las visitas al mdico, las cuales pueden promover la salud y el bienestar. Esto puede comprender lo siguiente:  Un examen fsico anual. Esto tambin se conoce como control de bienestar anual.  Exmenes dentales y oculares de manera regular.  Vacunas.  Estudios para detectar ciertas enfermedades.  Opciones saludables de estilo de vida, como seguir una dieta saludable, hacer ejercicio regularmente, no usar drogas ni productos que contengan nicotina y tabaco, y limitar el consumo de alcohol. Qu puedo esperar para mi visita de cuidado preventivo? Examen fsico El mdico controlar lo siguiente:  Estatura y peso. Estos datos se pueden usar para calcular el ndice de masa corporal (IMC), una medicin que indica si usted tiene un peso saludable.  Frecuencia cardaca y presin arterial.  Piel para detectar manchas anormales. Asesoramiento El mdico puede hacerle preguntas sobre lo siguiente:  Consumo de tabaco, alcohol y drogas.  Bienestar emocional.  Bienestar en el hogar y sus relaciones personales.  Actividad sexual.  Hbitos de alimentacin.  Trabajo y ambiente laboral. Qu vacunas necesito?  Vacuna antigripal  Se recomienda aplicarse esta vacuna todos los aos. Vacuna contra el ttanos, la difteria y la tos ferina (Tdap)  Es posible que tenga que aplicarse un refuerzo contra el ttanos y la difteria (DT) cada 10aos. Vacuna contra la varicela  Es posible que tenga que aplicrsela si an no la recibi. Vacuna contra el herpes zster (culebrilla)  Es posible que la necesite despus de los 60 aos de edad. Vacuna contra el sarampin, la rubola y las paperas (SRP)  Es posible que necesite aplicarse al menos una dosis de la vacuna SRP si naci despus de 1957. Tambin es posible que  necesite una segunda dosis. Vacuna antineumoccica conjugada (PCV13)  Puede necesitar esta vacuna si tiene determinadas enfermedades y no se vacun anteriormente. Vacuna antineumoccica de polisacridos (PPSV23)  Quizs tenga que aplicarse una o dos dosis si fuma o si tiene determinadas afecciones. Vacuna antimeningoccica conjugada (MenACWY)  Puede necesitar esta vacuna si tiene determinadas afecciones. Vacuna contra la hepatitis A  Es posible que necesite esta vacuna si tiene ciertas afecciones o si viaja o trabaja en lugares en los que podra estar expuesto a la hepatitis A. Vacuna contra la hepatitis B  Es posible que necesite esta vacuna si tiene ciertas afecciones o si viaja o trabaja en lugares en los que podra estar expuesto a la hepatitis B. Vacuna antihaemophilus influenzae tipo B (Hib)  Es posible que necesite esta vacuna si tiene algunos factores de riesgo. Vacuna contra el virus del papiloma humano (VPH)  Si el mdico se lo recomienda, puede necesitar tres dosis a lo largo de 6 meses. Puede recibir las vacunas en forma de dosis individuales o en forma de dos o ms vacunas juntas en la misma inyeccin (vacunas combinadas). Hable con su mdico sobre los riesgos y beneficios de las vacunas combinadas. Qu pruebas necesito? Anlisis de sangre  Niveles de lpidos y colesterol. Estos se pueden verificar cada 5 aos o, con ms frecuencia, si usted tiene ms de 50 aos de edad.  Anlisis de hepatitisC.  Anlisis de hepatitisB. Pruebas de deteccin  Pruebas de deteccin de cncer de pulmn. Es posible que se le realice esta prueba de deteccin a partir de los 55 aos de edad, si ha fumado durante 30 aos un   paquete diario y sigue fumando o dej el hbito en algn momento en los ltimos 15 aos.  Examen de deteccin del cncer de prstata. Las recomendaciones variarn segn sus antecedentes familiares y otros riesgos.  Pruebas de deteccin de cncer colorrectal. Todos los  adultos a partir de los 50 aos de edad y hasta los 75 aos de edad deben hacerse esta prueba de deteccin. El mdico puede recomendarle las pruebas de deteccin a partir de los 45 aos de edad si corre un mayor riesgo. Le realizarn pruebas cada 1 a 10 aos, segn los resultados y el tipo de prueba de deteccin.  Pruebas de deteccin de la diabetes. Esto se realiza mediante un control del azcar en la sangre (glucosa) despus de no haber comido durante un periodo de tiempo (ayuno). Es posible que se le realice esta prueba cada 1 a 3 aos.  Anlisis de enfermedades de transmisin sexual (ETS). Siga estas instrucciones en su casa: Comida y bebida  Siga una dieta que incluya frutas y verduras frescas, cereales integrales, protenas magras y productos lcteos descremados.  Tome los suplementos vitamnicos y minerales como se lo haya indicado el mdico.  No beba alcohol si el mdico se lo prohbe.  Si bebe alcohol: ? Limite la cantidad que consume de 0 a 2 medidas por da. ? Est atento a la cantidad de alcohol que hay en las bebidas que toma. En los [place], una medida equivale a una botella de cerveza de 12oz (355ml), un vaso de vino de 5oz (148ml) o un vaso de una bebida alcohlica de alta graduacin de 1oz (44ml). Estilo de vida  Cudese los dientes y las encas a diario.  Mantngase activo. Haga al menos 30minutos de ejercicio 5o ms das de la semana.  No consuma ningn producto que contenga nicotina o tabaco, como cigarrillos, cigarrillos electrnicos y tabaco de mascar. Si necesita ayuda para dejar de fumar, consulte al mdico.  Si es sexualmente activo, practique sexo seguro. Use un condn u otra forma de proteccin para prevenir las ITS (infecciones de transmisin sexual).  Hable con el mdico acerca de tomar una dosis baja de aspirina o estatina todos los das a partir de los 50 aos. Cundo volver?  Acuda al mdico una vez al ao para una visita de  control.  Pregntele al mdico con qu frecuencia debe realizarse un control de la vista y los dientes.  Mantenga su esquema de vacunacin al da. Esta informacin no tiene como fin reemplazar el consejo del mdico. Asegrese de hacerle al mdico cualquier pregunta que tenga. Document Revised: 04/03/2018 Document Reviewed: 04/03/2018 Elsevier Patient Education  2020 Elsevier Inc.  

## 2019-09-21 NOTE — Assessment & Plan Note (Signed)
Well adult Orders Placed This Encounter  Procedures  . COMPLETE METABOLIC PANEL WITH GFR  . CBC  . Lipid Panel w/reflex Direct LDL  Screening: UTD, due to repeat colonoscopy later this year.  Immunizations: UTD Anticipatory guidance/Risk factor reduction:  Recommendations per AVS

## 2019-09-21 NOTE — Progress Notes (Signed)
Jonathan Mclaughlin - 54 y.o. male MRN [id number]  Date of birth: 05/02/1965  Subjective Chief Complaint  Patient presents with  . Annual Exam    HPI  Jonathan Mclaughlin is a 54 y.o. male with history of HLD here today for annual exam.  He denies any significant changes to his health since his last visit.    He is a non-smoker.  He denies EtOH use.    He does exercise occasionally but his job also keeps him pretty active.  He feels like he follows a healthy diet.   He is due for updated colonoscopy later this year.   Review of Systems  Constitutional: Negative for chills, fever, malaise/fatigue and weight loss.  HENT: Negative for congestion, ear pain and sore throat.   Eyes: Negative for blurred vision, double vision and pain.  Respiratory: Negative for cough and shortness of breath.   Cardiovascular: Negative for chest pain and palpitations.  Gastrointestinal: Negative for abdominal pain, blood in stool, constipation, heartburn and nausea.  Genitourinary: Negative for dysuria and urgency.  Musculoskeletal: Negative for joint pain and myalgias.  Neurological: Negative for dizziness and headaches.  Endo/Heme/Allergies: Does not bruise/bleed easily.  Psychiatric/Behavioral: Negative for depression. The patient is not nervous/anxious and does not have insomnia.     No Known Allergies  Past Medical History:  Diagnosis Date  . Aspiration pneumonia ([place]) 09/27/2016  . CAD (coronary artery disease) 09/17/2017  . History of colonic polyps 01/12/2015   Repeat 11/2019   . Hyperlipidemia     Past Surgical History:  Procedure Laterality Date  . COLONOSCOPY W/ POLYPECTOMY    . WISDOM TOOTH EXTRACTION      Social History   Socioeconomic History  . Marital status: Divorced    Spouse name: Not on file  . Number of children: Not on file  . Years of education: Not on file  . Highest education level: Not on file  Occupational History  . Not on file  Tobacco Use  . Smoking status: Never  Smoker  . Smokeless tobacco: Never Used  Vaping Use  . Vaping Use: Never used  Substance and Sexual Activity  . Alcohol use: No    Alcohol/week: 0.0 standard drinks  . Drug use: No  . Sexual activity: Yes    Partners: Female  Other Topics Concern  . Not on file  Social History Narrative  . Not on file   Social Determinants of Health   Financial Resource Strain:   . Difficulty of Paying Living Expenses:   Food Insecurity:   . Worried About Charity fundraiser in the Last Year:   . Arboriculturist in the Last Year:   Transportation Needs:   . Film/video editor (Medical):   Marland Kitchen Lack of Transportation (Non-Medical):   Physical Activity:   . Days of Exercise per Week:   . Minutes of Exercise per Session:   Stress:   . Feeling of Stress :   Social Connections:   . Frequency of Communication with Friends and Family:   . Frequency of Social Gatherings with Friends and Family:   . Attends Religious Services:   . Active Member of Clubs or Organizations:   . Attends Archivist Meetings:   Marland Kitchen Marital Status:     Family History  Problem Relation Age of Onset  . Arthritis Mother   . Alcohol abuse Father   . Colon cancer Neg Hx   . Heart attack Neg Hx  Health Maintenance  Topic Date Due  . COVID-19 Vaccine (1) Never done  . HIV Screening  Never done  . INFLUENZA VACCINE  10/24/2019  . COLONOSCOPY  12/09/2019  . TETANUS/TDAP  03/25/2021  . Hepatitis C Screening  Completed     ----------------------------------------------------------------------------------------------------------------------------------------------------------------------------------------------------------------- Physical Exam BP 111/75 (BP Location: Left Arm, Patient Position: Sitting, Cuff Size: Normal)   Pulse 97   Temp 98 F (36.7 C)   Wt 138 lb (62.6 kg)   SpO2 98%   BMI 25.24 kg/m   Physical Exam Constitutional:      General: He is not in acute distress. HENT:     Head:  Normocephalic and atraumatic.     Right Ear: External ear normal.     Left Ear: External ear normal.  Eyes:     General: No scleral icterus. Neck:     Thyroid: No thyromegaly.  Cardiovascular:     Rate and Rhythm: Normal rate and regular rhythm.     Heart sounds: Normal heart sounds.  Pulmonary:     Effort: Pulmonary effort is normal.     Breath sounds: Normal breath sounds.  Abdominal:     General: Bowel sounds are normal. There is no distension.     Palpations: Abdomen is soft.     Tenderness: There is no abdominal tenderness. There is no guarding.  Musculoskeletal:     Cervical back: Normal range of motion.  Lymphadenopathy:     Cervical: No cervical adenopathy.  Skin:    General: Skin is warm and dry.     Findings: No rash.  Neurological:     Mental Status: He is alert and oriented to person, place, and time.     Cranial Nerves: No cranial nerve deficit.     Motor: No abnormal muscle tone.  Psychiatric:        Behavior: Behavior normal.     ------------------------------------------------------------------------------------------------------------------------------------------------------------------------------------------------------------------- Assessment and Plan  Well adult exam Well adult Orders Placed This Encounter  Procedures  . COMPLETE METABOLIC PANEL WITH GFR  . CBC  . Lipid Panel w/reflex Direct LDL  Screening: UTD, due to repeat colonoscopy later this year.  Immunizations: UTD Anticipatory guidance/Risk factor reduction:  Recommendations per AVS   No orders of the defined types were placed in this encounter.   No follow-ups on file.    This visit occurred during the SARS-CoV-2 public health emergency.  Safety protocols were in place, including screening questions prior to the visit, additional usage of staff PPE, and extensive cleaning of exam room while observing appropriate contact time as indicated for disinfecting solutions.

## 2019-09-23 ENCOUNTER — Other Ambulatory Visit: Payer: Self-pay

## 2019-09-23 ENCOUNTER — Ambulatory Visit (INDEPENDENT_AMBULATORY_CARE_PROVIDER_SITE_OTHER): Payer: PRIVATE HEALTH INSURANCE | Admitting: Family Medicine

## 2019-09-23 ENCOUNTER — Encounter: Payer: Self-pay | Admitting: Family Medicine

## 2019-09-23 DIAGNOSIS — M25511 Pain in right shoulder: Secondary | ICD-10-CM

## 2019-09-23 DIAGNOSIS — M25512 Pain in left shoulder: Secondary | ICD-10-CM | POA: Diagnosis not present

## 2019-09-23 DIAGNOSIS — S29011A Strain of muscle and tendon of front wall of thorax, initial encounter: Secondary | ICD-10-CM

## 2019-09-23 LAB — COMPLETE METABOLIC PANEL WITH GFR
AG Ratio: 1.7 (calc) (ref 1.0–2.5)
ALT: 42 U/L (ref 9–46)
AST: 27 U/L (ref 10–35)
Albumin: 4.2 g/dL (ref 3.6–5.1)
Alkaline phosphatase (APISO): 66 U/L (ref 35–144)
BUN: 14 mg/dL (ref 7–25)
CO2: 29 mmol/L (ref 20–32)
Calcium: 8.7 mg/dL (ref 8.6–10.3)
Chloride: 104 mmol/L (ref 98–110)
Creat: 0.77 mg/dL (ref 0.70–1.33)
GFR, Est African American: 118 mL/min/{1.73_m2} (ref >=60)
GFR, Est Non African American: 102 mL/min/{1.73_m2} (ref >=60)
Globulin: 2.5 g/dL (calc) (ref 1.9–3.7)
Glucose, Bld: 92 mg/dL (ref 65–99)
Potassium: 4.1 mmol/L (ref 3.5–5.3)
Sodium: 139 mmol/L (ref 135–146)
Total Bilirubin: 0.9 mg/dL (ref 0.2–1.2)
Total Protein: 6.7 g/dL (ref 6.1–8.1)

## 2019-09-23 LAB — LIPID PANEL W/REFLEX DIRECT LDL
Cholesterol: 204 mg/dL — ABNORMAL HIGH (ref <200)
HDL: 43 mg/dL (ref >=40)
LDL Cholesterol (Calc): 135 mg/dL (calc) — ABNORMAL HIGH
Non-HDL Cholesterol (Calc): 161 mg/dL (calc) — ABNORMAL HIGH (ref <130)
Total CHOL/HDL Ratio: 4.7 (calc) (ref <5.0)
Triglycerides: 139 mg/dL (ref <150)

## 2019-09-23 LAB — CBC
HCT: 47.1 % (ref 38.5–50.0)
Hemoglobin: 15.8 g/dL (ref 13.2–17.1)
MCH: 30.1 pg (ref 27.0–33.0)
MCHC: 33.5 g/dL (ref 32.0–36.0)
MCV: 89.7 fL (ref 80.0–100.0)
MPV: 9.6 fL (ref 7.5–12.5)
Platelets: 233 10*3/uL (ref 140–400)
RBC: 5.25 10*6/uL (ref 4.20–5.80)
RDW: 13.1 % (ref 11.0–15.0)
WBC: 8.9 10*3/uL (ref 3.8–10.8)

## 2019-09-23 MED ORDER — CYCLOBENZAPRINE HCL 10 MG PO TABS: 10.0000 mg | ORAL_TABLET | Freq: Three times a day (TID) | ORAL | 0 refills | Status: AC | PRN

## 2019-09-23 MED ORDER — MELOXICAM 15 MG PO TABS: ORAL_TABLET | ORAL | 3 refills | Status: AC

## 2019-09-23 NOTE — Patient Instructions (Signed)
Distensin muscular Muscle Strain Una distensin muscular es una lesin que se produce cuando un msculo se estira ms all de su largo normal. Puede ocurrir durante cadas, mientras se practican deportes o se levantan objetos. A causa de una distensin, pueden rasgarse las fibras musculares. En general, la recuperacin de una distensin muscular tarda de 1 a 2semanas. La recuperacin completa normalmente tarda de 5 a 6semanas. Inicialmente, se trata con terapia PRICE (proteccin, reposo, hielo, compresin, elevacin). Esto implica:  Proteger al msculo para que no sufra nuevas lesiones.  Dejar en reposo el msculo lesionado.  Aplicar hielo en el msculo lesionado.  Aplicar presin (compresin) en el msculo lesionado. Esto se puede hacer con una frula o una venda elstica.  Levantar (elevar) el msculo lesionado. Adems, el mdico puede recomendarle analgsicos. Sigue estas instrucciones en tu casa: Si tiene una frula:  Use la frula como se lo haya indicado el mdico. Quteselo solamente como se lo haya indicado el mdico.  Afloje la frula si los dedos de la mano o de los pies se le adormecen, si siente hormigueo o si se le enfran y se tornan de Optician, dispensing.  Mantenga la frula limpia.  Si la frula no es impermeable: ? No deje que se moje. ? Cbralos con un envoltorio hermtico cuando tome un bao de inmersin o [place]. Control del dolor, el entumecimiento y la hinchazn   Si se lo indican, aplique hielo en el lugar de la lesin. ? Si tiene una frula desmontable, qutesela como se lo haya indicado el mdico. ? Ponga el hielo en una bolsa plstica. ? Coloque una [employer] piel y Therapist, nutritional. ? Coloque el hielo durante 59minutos, 2 a 3veces por da.  Mueva los dedos de las manos o de los pies con Camera operator. Esto ayuda a English as a second language teacher entumecimiento y Air cabin crew.  Cuando est sentado o acostado, mantenga el lugar lesionado por encima del nivel del  corazn.  Use una venda elstica segn las indicaciones del mdico. Asegrese de no ajustarla demasiado. Indicaciones generales  [employer] de venta libre y los recetados solamente como se lo haya indicado el mdico.  Limite las [place]. Deje en reposo el msculo lesionado como se lo haya indicado el mdico. El mdico puede decirle que los movimientos suaves no representan un problema.  Si le indicaron fisioterapia, haga los ejercicios como se lo haya indicado el mdico.  No ejerza presin en ninguna parte de la frula hasta que se haya endurecido por completo. Esto puede tardar muchas horas.  No consuma ningn producto que contenga nicotina o tabaco, como cigarrillos y Psychologist, sport and exercise. Estos pueden retrasar la consolidacin del [place]. Si necesita ayuda para dejar de consumir, consulte al [employer].  Entre en calor antes de hacer ejercicio. Esto ayuda a prevenir futuras distensiones musculares.  Consulte al mdico cundo puede volver a conducir si tiene una frula.  Concurra a todas las visitas de seguimiento como se lo haya indicado el mdico. Esto es importante. Comunquese con un mdico si:  Siente ms dolor o tiene ms hinchazn en el lugar de la lesin. Solicite ayuda inmediatamente si:  Si tiene alguno de [employer] en el lugar de la lesin: ? Siente entumecimiento. ? Siente hormigueos. ? Pierde mucha fuerza. Resumen  Una distensin muscular es una lesin que se produce cuando un msculo se estira ms all de su largo normal.  Inicialmente, se trata con terapia PRICE (proteccin, reposo, hielo, compresin, elevacin). Esto implica proteger la lesin, aplicar  hielo y presin, levantar el rea y dejarla en reposo.  Limite las actividades. Deje en reposo el msculo lesionado como se lo haya indicado el mdico. El mdico puede decirle que los movimientos suaves no representan un problema.  Entre en calor antes de hacer ejercicio. Esto ayuda a prevenir  futuras distensiones musculares. Esta informacin no tiene Marine scientist el consejo del mdico. Asegrese de hacerle al mdico cualquier pregunta que tenga. Document Revised: 05/13/2018 Document Reviewed: 05/13/2018 Elsevier Patient Education  [place].

## 2019-09-23 NOTE — Progress Notes (Signed)
Jonathan Mclaughlin - 54 y.o. male MRN [id number]  Date of birth: 1965/12/02  Subjective Chief Complaint  Patient presents with  . muscle aches    HPI Jonathan Mclaughlin is a 54 y.o. male here today with complaint of chest wall pain.  Reports this started a few days ago after pushing a heavy cart at work.  He does have some pain in his back and neck as well.  He denies radiation down the arm, numbness, tingling or weakness.  He has had some relief with ibuprofen.  He does have some leftover meloxicam as well but hasn't tried this yet.   ROS:  A comprehensive ROS was completed and negative except as noted per HPI  No Known Allergies  Past Medical History:  Diagnosis Date  . Aspiration pneumonia ([place]) 09/27/2016  . CAD (coronary artery disease) 09/17/2017  . History of colonic polyps 01/12/2015   Repeat 11/2019   . Hyperlipidemia     Past Surgical History:  Procedure Laterality Date  . COLONOSCOPY W/ POLYPECTOMY    . WISDOM TOOTH EXTRACTION      Social History   Socioeconomic History  . Marital status: Divorced    Spouse name: Not on file  . Number of children: Not on file  . Years of education: Not on file  . Highest education level: Not on file  Occupational History  . Not on file  Tobacco Use  . Smoking status: Never Smoker  . Smokeless tobacco: Never Used  Vaping Use  . Vaping Use: Never used  Substance and Sexual Activity  . Alcohol use: No    Alcohol/week: 0.0 standard drinks  . Drug use: No  . Sexual activity: Yes    Partners: Female  Other Topics Concern  . Not on file  Social History Narrative  . Not on file   Social Determinants of Health   Financial Resource Strain:   . Difficulty of Paying Living Expenses:   Food Insecurity:   . Worried About Charity fundraiser in the Last Year:   . Arboriculturist in the Last Year:   Transportation Needs:   . Film/video editor (Medical):   Marland Kitchen Lack of Transportation (Non-Medical):   Physical Activity:   . Days of  Exercise per Week:   . Minutes of Exercise per Session:   Stress:   . Feeling of Stress :   Social Connections:   . Frequency of Communication with Friends and Family:   . Frequency of Social Gatherings with Friends and Family:   . Attends Religious Services:   . Active Member of Clubs or Organizations:   . Attends Archivist Meetings:   Marland Kitchen Marital Status:     Family History  Problem Relation Age of Onset  . Arthritis Mother   . Alcohol abuse Father   . Colon cancer Neg Hx   . Heart attack Neg Hx     Health Maintenance  Topic Date Due  . COVID-19 Vaccine (1) Never done  . HIV Screening  Never done  . INFLUENZA VACCINE  10/24/2019  . COLONOSCOPY  12/09/2019  . TETANUS/TDAP  03/25/2021  . Hepatitis C Screening  Completed     ----------------------------------------------------------------------------------------------------------------------------------------------------------------------------------------------------------------- Physical Exam BP 93/64 (BP Location: Left Arm, Patient Position: Sitting, Cuff Size: Normal)   Pulse 79   Ht 5' 1.81" (1.57 m)   Wt 136 lb 4.8 oz (61.8 kg)   SpO2 96%   BMI 25.08 kg/m   Physical Exam Constitutional:  Appearance: Normal appearance.  HENT:     Head: Normocephalic and atraumatic.  Cardiovascular:     Rate and Rhythm: Normal rate and regular rhythm.  Pulmonary:     Effort: Pulmonary effort is normal.     Breath sounds: Normal breath sounds.  Chest:     Chest wall: No tenderness.  Musculoskeletal:     Cervical back: Neck supple.     Comments: Pain across R side of chest with movement of R arm.  Strength is normal.   Skin:    General: Skin is warm and dry.  Neurological:     General: No focal deficit present.     Mental Status: He is alert.  Psychiatric:        Mood and Affect: Mood normal.        Behavior: Behavior normal.      ------------------------------------------------------------------------------------------------------------------------------------------------------------------------------------------------------------------- Assessment and Plan  Pectoralis muscle strain Given handout for home exercises.   Adding flexeril and meloxicam renewed.  Instructed to call back if not improving over the next couple of weeks.    Meds ordered this encounter  Medications  . meloxicam (MOBIC) 15 MG tablet    Sig: One tab PO qAM with a meal for 2 weeks, then daily prn pain.    Dispense:  30 tablet    Refill:  3  . cyclobenzaprine (FLEXERIL) 10 MG tablet    Sig: Take 1 tablet (10 mg total) by mouth 3 (three) times daily as needed for muscle spasms.    Dispense:  30 tablet    Refill:  0    No follow-ups on file.    This visit occurred during the SARS-CoV-2 public health emergency.  Safety protocols were in place, including screening questions prior to the visit, additional usage of staff PPE, and extensive cleaning of exam room while observing appropriate contact time as indicated for disinfecting solutions.

## 2019-09-23 NOTE — Assessment & Plan Note (Signed)
Given handout for home exercises.   Adding flexeril and meloxicam renewed.  Instructed to call back if not improving over the next couple of weeks.
